# Patient Record
Sex: Male | Born: 2000 | Race: Black or African American | Hispanic: No | Marital: Single | Smoking: Current every day smoker
Health system: Southern US, Community
[De-identification: ages and names within clinical notes are randomized; demographics above are authoritative.]

## PROBLEM LIST (undated history)

## (undated) DIAGNOSIS — I10 Essential (primary) hypertension: Secondary | ICD-10-CM

## (undated) DIAGNOSIS — F319 Bipolar disorder, unspecified: Secondary | ICD-10-CM

## (undated) HISTORY — PX: CIRCUMCISION: SUR203

---
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
## 2018-04-09 ENCOUNTER — Other Ambulatory Visit: Payer: Self-pay

## 2018-04-09 ENCOUNTER — Emergency Department (HOSPITAL_COMMUNITY)
Admission: EM | Admit: 2018-04-09 | Discharge: 2018-04-09 | Disposition: A | Discharge status: Home / Self Care | Payer: Medicaid Other | Attending: Emergency Medicine | Admitting: Emergency Medicine

## 2018-04-09 ENCOUNTER — Encounter (HOSPITAL_COMMUNITY): Payer: Self-pay | Admitting: Emergency Medicine

## 2018-04-09 DIAGNOSIS — M79605 Pain in left leg: Secondary | ICD-10-CM | POA: Diagnosis not present

## 2018-04-09 DIAGNOSIS — J3489 Other specified disorders of nose and nasal sinuses: Secondary | ICD-10-CM | POA: Insufficient documentation

## 2018-04-09 DIAGNOSIS — Z5321 Procedure and treatment not carried out due to patient leaving prior to being seen by health care provider: Secondary | ICD-10-CM | POA: Diagnosis not present

## 2018-04-09 DIAGNOSIS — M79604 Pain in right leg: Secondary | ICD-10-CM | POA: Diagnosis not present

## 2018-04-09 HISTORY — DX: Essential (primary) hypertension: I10

## 2018-04-09 NOTE — ED Triage Notes (Signed)
C/o bilateral legs aching and runny nose since this morning.  Denies cough.  Denies fever.

## 2018-04-09 NOTE — ED Notes (Signed)
No answer for room x 1 

## 2018-04-09 NOTE — ED Notes (Signed)
Pt walking out of ED.  Informed him they were getting ready to call him to treatment room.  Visitor with him states they aren't waiting.

## 2018-04-09 NOTE — ED Notes (Signed)
Pt went outside to smoke

## 2018-06-22 ENCOUNTER — Observation Stay (HOSPITAL_COMMUNITY)
Admission: RE | Admit: 2018-06-22 | Discharge: 2018-06-22 | Disposition: A | Discharge status: Home / Self Care | Payer: Medicaid Other | Attending: Psychiatry | Admitting: Psychiatry

## 2018-06-22 ENCOUNTER — Encounter (HOSPITAL_COMMUNITY): Payer: Self-pay

## 2018-06-22 ENCOUNTER — Other Ambulatory Visit: Payer: Self-pay

## 2018-06-22 ENCOUNTER — Ambulatory Visit (HOSPITAL_COMMUNITY): Payer: Self-pay

## 2018-06-22 DIAGNOSIS — R456 Violent behavior: Secondary | ICD-10-CM | POA: Diagnosis not present

## 2018-06-22 DIAGNOSIS — F1290 Cannabis use, unspecified, uncomplicated: Secondary | ICD-10-CM | POA: Diagnosis not present

## 2018-06-22 DIAGNOSIS — F419 Anxiety disorder, unspecified: Secondary | ICD-10-CM | POA: Insufficient documentation

## 2018-06-22 DIAGNOSIS — F322 Major depressive disorder, single episode, severe without psychotic features: Secondary | ICD-10-CM | POA: Diagnosis not present

## 2018-06-22 DIAGNOSIS — F319 Bipolar disorder, unspecified: Secondary | ICD-10-CM | POA: Diagnosis present

## 2018-06-22 DIAGNOSIS — Z79899 Other long term (current) drug therapy: Secondary | ICD-10-CM | POA: Insufficient documentation

## 2018-06-22 DIAGNOSIS — F3189 Other bipolar disorder: Secondary | ICD-10-CM | POA: Diagnosis not present

## 2018-06-22 DIAGNOSIS — Z8249 Family history of ischemic heart disease and other diseases of the circulatory system: Secondary | ICD-10-CM | POA: Insufficient documentation

## 2018-06-22 DIAGNOSIS — F10920 Alcohol use, unspecified with intoxication, uncomplicated: Secondary | ICD-10-CM | POA: Diagnosis not present

## 2018-06-22 DIAGNOSIS — I10 Essential (primary) hypertension: Secondary | ICD-10-CM | POA: Insufficient documentation

## 2018-06-22 LAB — COMPREHENSIVE METABOLIC PANEL
ALT: 16 U/L (ref 0–44)
AST: 17 U/L (ref 15–41)
Albumin: 3.9 g/dL (ref 3.5–5.0)
Alkaline Phosphatase: 84 U/L (ref 38–126)
Anion gap: 13 (ref 5–15)
BUN: 8 mg/dL (ref 6–20)
CO2: 23 mmol/L (ref 22–32)
Calcium: 9.2 mg/dL (ref 8.9–10.3)
Chloride: 102 mmol/L (ref 98–111)
Creatinine, Ser: 0.89 mg/dL (ref 0.61–1.24)
GFR calc Af Amer: >60 mL/min (ref >60)
GFR calc non Af Amer: >60 mL/min (ref >60)
Glucose, Bld: 96 mg/dL (ref 70–99)
Potassium: 3.5 mmol/L (ref 3.5–5.1)
Sodium: 138 mmol/L (ref 135–145)
Total Bilirubin: 0.5 mg/dL (ref 0.3–1.2)
Total Protein: 7.3 g/dL (ref 6.5–8.1)

## 2018-06-22 LAB — CBC
HCT: 41.7 % (ref 39.0–52.0)
Hemoglobin: 13.6 g/dL (ref 13.0–17.0)
MCH: 30.7 pg (ref 26.0–34.0)
MCHC: 32.6 g/dL (ref 30.0–36.0)
MCV: 94.1 fL (ref 80.0–100.0)
Platelets: 334 10*3/uL (ref 150–400)
RBC: 4.43 MIL/uL (ref 4.22–5.81)
RDW: 12 % (ref 11.5–15.5)
WBC: 11.5 10*3/uL — ABNORMAL HIGH (ref 4.0–10.5)
nRBC: 0 % (ref 0.0–0.2)

## 2018-06-22 LAB — TSH: TSH: 1.847 u[IU]/mL (ref 0.350–4.500)

## 2018-06-22 MED ORDER — ALUM & MAG HYDROXIDE-SIMETH 200-200-20 MG/5ML PO SUSP
30.0000 mL | ORAL | Status: DC | PRN
  Filled 2018-06-22: qty 30

## 2018-06-22 MED ORDER — ACETAMINOPHEN 325 MG PO TABS
650.0000 mg | ORAL_TABLET | Freq: Four times a day (QID) | ORAL | Status: DC | PRN
  Filled 2018-06-22: qty 2

## 2018-06-22 MED ORDER — MAGNESIUM HYDROXIDE 400 MG/5ML PO SUSP
30.0000 mL | Freq: Every day | ORAL | Status: DC | PRN
  Filled 2018-06-22: qty 30

## 2018-06-22 MED ORDER — HYDROXYZINE HCL 25 MG PO TABS
25.0000 mg | ORAL_TABLET | Freq: Three times a day (TID) | ORAL | Status: DC | PRN
Start: 2018-06-22 — End: 2018-06-22
  Filled 2018-06-22: qty 1

## 2018-06-22 NOTE — Progress Notes (Signed)
Patient ID: Arthur Martinez, male   DOB: January 09, 2001, 18 y.o.   MRN: [id number]  Patient discharged per MD orders. Patient given education regarding follow-up appointments. Patient denies any questions or concerns about these instructions. Patient was  given belongings before discharge. Patient currently denies SI/HI and auditory and visual hallucinations on discharge.

## 2018-06-22 NOTE — Patient Outreach (Signed)
CPSS met with the patient in the 200 hall at [facility] in order to provide substance use recovery support and help with substance use treatment resources. Patient states his substance use is manageable at this time, and denies a past history of drug or alcohol addiction. CPSS still provided CPSS contact information. CPSS strongly encouraged the patient to continue to stay in contact with CPSS for substance use recovery support if his substance becomes unmanageable, and would like help getting connected to substance use treatment resources.

## 2018-06-22 NOTE — Plan of Care (Signed)
BHH Observation Crisis Plan  Reason for Crisis Plan:  Crisis Stabilization   Plan of Care:  Referral for Telepsychiatry/Psychiatric Consult  Family Support:      Current Living Environment:  Living Arrangements: Parent  Insurance:   Hospital Account    Name Acct ID Class Status Primary Coverage   Arthur Martinez, Arthur Martinez [id number] BEHAVIORAL HEALTH OBSERVATION Open [place]        Guarantor Account (for Hospital Account [id number])    Name Relation to Pt Service Area Active? Acct Type   Arthur Martinez Self [facility] Yes Behavioral Health   Address Phone       [address] [place], [place] [address] [phone](H)          Coverage Information (for Hospital Account [id number])    F/O Payor/Plan Precert #   [facility] [place]    Subscriber Subscriber #   Arthur Martinez, Arthur Martinez [id number]   Address Phone   [address] [place], [place] [address] [phone]      Legal Guardian:  Legal Guardian: (P) (self)  Primary Care Provider:  Charlene Brooke, MD  Current Outpatient Providers:  [facility]  Psychiatrist:  Name of Psychiatrist: (P) (none)  Counselor/Therapist:  Name of Therapist: (P) (none)  Compliant with Medications:  No  Additional Information:   Arthur Martinez 4/21/20203:43 AM

## 2018-06-22 NOTE — Discharge Instructions (Signed)
For your mental health needs, you are advised to follow up with [facility].  Call them at your earliest opportunity to schedule an intake appointment:       [facility]      [address]      [place], [place] [address]      [phone]      Crisis number: [phone]  When dealing with the loss of a family member or loved one, grief counseling may be helpful.  Contact [facility] at your earliest opportunity and ask about enrolling in their program:       [facility]      [address] [place], [place] [address]      [phone]
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
## 2018-06-22 NOTE — BH Assessment (Signed)
Assessment Note  Arthur Martinez is an 18 y.o. male presenting under IVC for being off medications, drug usage and increased depression. Patient reported having an argument with his mother regarding his younger brother. Patient reported his mother left the house, he left the house, upon return police met him at house and brought him in to [facility] under IVC. Patient denied SI, HI and psychosis. Patient reported and grief loss issues, increased depression since death of younger brother 2 months ago. Patient reported  Increased anger and always having problems with anger. Patient reported after having conversation with father he realized that he was mad at the world and that he was only hurting himself and that he has since learned let anger go. Patient reported continued depressive symptoms, feelings of guilt, isolation, increased sleep, loss of interest, irritable, increased anxiety, worthless and fatigue. Per medical record patient has history of bipolar and was seen in 2016 for depression at [facility]. See IVC below.   Patient reported reported residing with father. Patient denied receiving outpatient mental health services. Patient denied being on any mental health medications. Patient denied criminal charges and court dates. Patient is currently on probation for resisting arrest "because my friend was band from property". Patient reported he is currently completing his GED and is enrolled in [employer] [place]. Patient was cooperative during assessment.   Collateral Contact: Sumit Branham, father, [phone], attempted contact, no contact, TTS will attempt again to gain additional information regarding patient.  Father is petitioner of IVC.   PER IVC: -HE IS CURRENTLY OFF HIS MEDICATION. -HE ISN'T SLEEPING, EATING OR TENDING TO PERSONAL HYGIENE. -TODAY HE CALLED HIS EX GIRLFRIEND AND TOLD HER SHE SHOULD KILLED HIM WHEN HIS BROTHER DIED. -HE STATED THAT HE WANTED TO DIE -HE HAS  BEEN VERY AGGRESSIVE WITH THE POLICE DEPARTMENT. -HE HAS STATED SEVERAL TIMES THAT HE IS GOING TO SOMEONE ELSE TO GET HIS 'MONEY' EVEN IF HE HAS TO HURT THEM. -FAMILY FOUND HIM SLEEPING ON THE SIDE OF THE HIGHWAY.  -HE'S CURRENTLY A HEAVY DRUG USER (COCAINE, PERC, XANAX, HEROIN, ICE).   Diagnosis: Major depressive disorder  Past Medical History:  Past Medical History:  Diagnosis Date  . Hypertension     Past Surgical History:  Procedure Laterality Date  . CIRCUMCISION      Family History:  Family History  Problem Relation Age of Onset  . Heart attack Maternal Grandfather   . Bipolar disorder Maternal Grandfather   . Schizophrenia Maternal Grandfather   . Stroke Paternal Grandfather   . Depression Mother   . Anxiety disorder Mother   . ADD / ADHD Brother   . Depression Maternal Grandmother   . Anxiety disorder Maternal Grandmother   . Migraines Other     Social History:  reports that he has been smoking cigarettes. He has a 3.50 pack-year smoking history. He has never used smokeless tobacco. He reports current alcohol use. He reports current drug use. Frequency: 7.00 times per week. Drug: Marijuana.  Additional Social History:  Alcohol / Drug Use Pain Medications: see MAR Prescriptions: see MAR Over the Counter: see MAR  CIWA: CIWA-Ar BP: (!) 135/91 Pulse Rate: 92 Nausea and Vomiting: no nausea and no vomiting Tactile Disturbances: none Tremor: no tremor Auditory Disturbances: not present Paroxysmal Sweats: no sweat visible Visual Disturbances: not present Anxiety: no anxiety, at ease Headache, Fullness in Head: none present Agitation: normal activity Orientation and Clouding of Sensorium: oriented and can do serial additions CIWA-Ar Total: 0  COWS: Clinical Opiate Withdrawal Scale (COWS) Resting Pulse Rate: Pulse Rate 81-100 Sweating: No report of chills or flushing Restlessness: Able to sit still Pupil Size: Pupils pinned or normal size for room light Bone  or Joint Aches: Not present Runny Nose or Tearing: Not present GI Upset: Vomiting or diarrhea Tremor: No tremor Yawning: No yawning Anxiety or Irritability: Patient reports increasing irritability or anxiousness Gooseflesh Skin: Skin is smooth COWS Total Score: 5  Allergies: No Known Allergies  Home Medications:  Medications Prior to Admission  Medication Sig Dispense Refill  . acetaminophen (TYLENOL) 325 MG tablet Take 650 mg by mouth every 6 (six) hours as needed.    Marland Kitchen amitriptyline (ELAVIL) 25 MG tablet Take 1 tablet (25 mg total) by mouth at bedtime. 30 tablet 3  . amoxicillin (AMOXIL) 500 MG capsule Take 2 capsules (1,000 mg total) by mouth 2 (two) times daily. 40 capsule 0  . cloNIDine (CATAPRES) 0.3 MG tablet Take 0.3 mg by mouth at bedtime.    Marland Kitchen HYDROcodone-acetaminophen (NORCO/VICODIN) 5-325 MG tablet Take 1-2 tablets by mouth every 4 (four) hours as needed. 10 tablet 0  . Magnesium Oxide 500 MG TABS Take by mouth.    . riboflavin (VITAMIN B-2) 100 MG TABS tablet Take 100 mg by mouth daily.      OB/GYN Status:  No LMP for male patient.  General Assessment Data Location of Assessment: [facility] Services TTS Assessment: In system Is this a Tele or Face-to-Face Assessment?: Face-to-Face Is this an Initial Assessment or a Re-assessment for this encounter?: Initial Assessment Patient Accompanied by:: N/A Language Other than English: No Living Arrangements: (family home with father) What gender do you identify as?: Male Marital status: Single Living Arrangements: Parent Can pt return to current living arrangement?: Yes Admission Status: Involuntary Petitioner: Family member Is patient capable of signing voluntary admission?: (IVC) Referral Source: Self/Family/Friend  Crisis Care Plan Living Arrangements: Parent Legal Guardian: (self) Name of Psychiatrist: (none) Name of Therapist: (none)  Education Status Is patient currently in school?: Yes([employer]) Current  Grade: (GED) Highest grade of school patient has completed: (11th) Name of school: ([employer])  Risk to self with the past 6 months Suicidal Ideation: No Has patient been a risk to self within the past 6 months prior to admission? : No Suicidal Intent: No Has patient had any suicidal intent within the past 6 months prior to admission? : No Is patient at risk for suicide?: No Suicidal Plan?: No Has patient had any suicidal plan within the past 6 months prior to admission? : No Access to Means: No What has been your use of drugs/alcohol within the last 12 months?: ("beer") Previous Attempts/Gestures: No How many times?: (0) Other Self Harm Risks: (denied) Triggers for Past Attempts: (n/a) Intentional Self Injurious Behavior: None Family Suicide History: Yes(cousin 3 years ago) Recent stressful life event(s): (death of brother) Persecutory voices/beliefs?: No Depression: Yes Depression Symptoms: Insomnia, Tearfulness, Isolating, Fatigue, Guilt, Loss of interest in usual pleasures, Feeling worthless/self pity, Feeling angry/irritable Substance abuse history and/or treatment for substance abuse?: No Suicide prevention information given to non-admitted patients: Not applicable  Risk to Others within the past 6 months Homicidal Ideation: No Does patient have any lifetime risk of violence toward others beyond the six months prior to admission? : No Thoughts of Harm to Others: No Current Homicidal Intent: No Current Homicidal Plan: No Access to Homicidal Means: No Identified Victim: (n/a) History of harm to others?: No Assessment of Violence: None Noted Violent Behavior Description: (none  reported) Does patient have access to weapons?: No Criminal Charges Pending?: No Does patient have a court date: No Is patient on probation?: No  Psychosis Hallucinations: None noted Delusions: None noted  Mental Status Report Appearance/Hygiene: Disheveled Eye Contact: Poor Motor Activity:  Freedom of movement Speech: Slow, Soft Level of Consciousness: Drowsy Mood: Other (Comment)(lethargic) Affect: Anxious Anxiety Level: Minimal Thought Processes: Coherent Judgement: Partial Orientation: Person, Place, Time, Situation Obsessive Compulsive Thoughts/Behaviors: None  Cognitive Functioning Concentration: Fair Memory: Recent Impaired Is patient IDD: No Insight: Fair Impulse Control: Poor Appetite: Fair Have you had any weight changes? : No Change Sleep: Decreased Total Hours of Sleep: ("alot") Vegetative Symptoms: None  ADLScreening [facility] Assessment Services) Patient's cognitive ability adequate to safely complete daily activities?: Yes Patient able to express need for assistance with ADLs?: Yes Independently performs ADLs?: Yes (appropriate for developmental age)  Prior Inpatient Therapy Prior Inpatient Therapy: No  Prior Outpatient Therapy Prior Outpatient Therapy: No Does patient have an ACCT team?: No Does patient have Intensive In-House Services?  : No Does patient have [facility] services? : No Does patient have P4CC services?: No  ADL Screening (condition at time of admission) Patient's cognitive ability adequate to safely complete daily activities?: Yes Is the patient deaf or have difficulty hearing?: No Does the patient have difficulty seeing, even when wearing glasses/contacts?: No Does the patient have difficulty concentrating, remembering, or making decisions?: No Patient able to express need for assistance with ADLs?: Yes Does the patient have difficulty dressing or bathing?: No Independently performs ADLs?: Yes (appropriate for developmental age) Does the patient have difficulty walking or climbing stairs?: No Weakness of Legs: None Weakness of Arms/Hands: None  Home Assistive Devices/Equipment Home Assistive Devices/Equipment: None  Therapy Consults (therapy consults require a physician order) PT Evaluation Needed: No OT Evalulation Needed:  No SLP Evaluation Needed: No Abuse/Neglect Assessment (Assessment to be complete while patient is alone) Abuse/Neglect Assessment Can Be Completed: Yes Physical Abuse: Denies Verbal Abuse: Denies Sexual Abuse: Denies Exploitation of patient/patient's resources: Denies Self-Neglect: Denies Values / Beliefs Cultural Requests During Hospitalization: None Spiritual Requests During Hospitalization: None Consults Spiritual Care Consult Needed: No Social Work Consult Needed: No Regulatory affairs officer (For Healthcare) Does Patient Have a Medical Advance Directive?: No Would patient like information on creating a medical advance directive?: No - Patient declined Nutrition Screen- [facility] Adult/WL/AP Patient's home diet: Regular Has the patient recently lost weight without trying?: No Has the patient been eating poorly because of a decreased appetite?: No Malnutrition Screening Tool Score: 0   Disposition:  Disposition Initial Assessment Completed for this Encounter: Yes  Lindon Romp, NP, recommends overnight observation. Larose Kells, and Legrand Como, RN, informed of disposition.   On Site Evaluation by:   Reviewed with Physician:    Venora Maples, [facility] 06/22/2018 3:45 AM

## 2018-06-22 NOTE — H&P (Signed)
[facility] Provider Admission PAA/H&P  Patient Identification: Arthur Martinez MRN:  [id number] Date of Evaluation:  06/22/2018 Chief Complaint:  MDD Principal Diagnosis: Severe major depression, single episode, without psychotic features ([place]) Diagnosis:  Active Problems:   Severe major depression, single episode, without psychotic features ([place])  History of Present Illness: Patient presented to [facility] via law enforcement under involuntary commitment. Patient was petitioned by his father. Petition states: "He is currently off his medication. He isn't sleeping, eating or tending to personal hygiene. Today he called his ex girlfriend and told her she should kill him when his brother died. He stated that he wanted to die. He has been very aggressive with the police department. He has states several times that he is going to someone else to get his Money even if he has to hurt them. Family found him sleeping on the side of the highway. He's currently a heavy drug user (cocaine, perc, xanax, heroin, ice)."  Patient reports marijuana and alcohol use, but denies use of other substances. Denies SI/HI/AVH.  From TTS assessment 06/22/2018: Patient reported having an argument with his mother regarding his younger brother. Patient reported his mother left the house, he left the house, upon return police met him at house and brought him in to [facility] under IVC. Patient denied SI, HI and psychosis. Patient reported and grief loss issues, increased depression since death of younger brother 2 months ago. Patient reported  Increased anger and always having problems with anger. Patient reported after having conversation with father he realized that he was mad at the world and that he was only hurting himself and that he has since learned let anger go. Patient reported continued depressive symptoms, feelings of guilt, isolation, increased sleep, loss of interest, irritable, increased anxiety, worthless and fatigue.  Per medical record patient has history of bipolar and was seen in 2016 for depression at [facility]. See IVC below.   Patient reported reported residing with father. Patient denied receiving outpatient mental health services. Patient denied being on any mental health medications. Patient denied criminal charges and court dates. Patient is currently on probation for resisting arrest "because my friend was band from property". Patient reported he is currently completing his GED and is enrolled in [employer] [employer] [place]. Patient was cooperative during assessment.   On exam: Patient is alert and oriented x 4, pleasant, and cooperative. Appears drowsy, states that he is just tired. Speech is clear and coherent, normal pace, low in volume. Mood is anxious and depressed. Affect is congruent with mood. Appears the patient has been crying. States that he has been crying and feeling sad today because his brother recently died. Denies suicidal ideations, homicidal ideations, and audiovisual hallucinations. Reports that he did smoke marijuana tonight and had two beers earlier in the day. Patient vomited once during the exam, clear fluid. Per patient and police he did vomit three times tonight. Patient states that it is because he became anxious when the police arrived to pick him up.   On chart review it was noted that patient was seen by psychiatry in the ED at [facility] on 07/22/2014 for SI and irritability. He was diagnosed as Bipolar Disorder, mixed, without psychosis. At that time he was prescribed Risperdal and Neurontin. No other mental health related records found.   Associated Signs/Symptoms: Depression Symptoms:  depressed mood, feelings of worthlessness/guilt, difficulty concentrating, anxiety, loss of energy/fatigue, (Hypo) Manic Symptoms:  Impulsivity, Irritable Mood, Anxiety Symptoms:  general anxiety Psychotic Symptoms:  Denies PTSD Symptoms: Negative Total Time spent with  patient: 30 minutes  Past Psychiatric History:   On chart review it was noted that patient was seen by psychiatry in the ED at [facility] on 07/22/2014 for SI and irritability. He was diagnosed as Bipolar Disorder, mixed, without psychosis. At that time he was prescribed Risperdal and Neurontin.   Is the patient at risk to self? Yes.    Has the patient been a risk to self in the past 6 months? No.  Has the patient been a risk to self within the distant past? Yes.    Is the patient a risk to others? No.  Has the patient been a risk to others in the past 6 months? No.  Has the patient been a risk to others within the distant past? No.   Prior Inpatient Therapy: Prior Inpatient Therapy: No Prior Outpatient Therapy: Prior Outpatient Therapy: No Does patient have an ACCT team?: No Does patient have Intensive In-House Services?  : No Does patient have [facility] services? : No Does patient have P4CC services?: No  Alcohol Screening:   Substance Abuse History in the last 12 months:  Yes.   Consequences of Substance Abuse: Negative Previous Psychotropic Medications: Yes  Psychological Evaluations: Yes  Past Medical History:  Past Medical History:  Diagnosis Date  . Hypertension     Past Surgical History:  Procedure Laterality Date  . CIRCUMCISION     Family History:  Family History  Problem Relation Age of Onset  . Heart attack Maternal Grandfather   . Bipolar disorder Maternal Grandfather   . Schizophrenia Maternal Grandfather   . Stroke Paternal Grandfather   . Depression Mother   . Anxiety disorder Mother   . ADD / ADHD Brother   . Depression Maternal Grandmother   . Anxiety disorder Maternal Grandmother   . Migraines Other    Family Psychiatric History: Unknown Tobacco Screening:   Social History:  Social History   Substance and Sexual Activity  Alcohol Use Yes     Social History   Substance and Sexual Activity  Drug Use Yes  . Frequency: 7.0 times per week  . Types:  Marijuana    Additional Social History: Marital status: Single    Pain Medications: see MAR Prescriptions: see MAR Over the Counter: see MAR                    Allergies:  No Known Allergies Lab Results: No results found for this or any previous visit (from the past 48 hour(s)).  Blood Alcohol level:  Lab Results  Component Value Date   ETH <5 60/63/0160    Metabolic Disorder Labs:  No results found for: HGBA1C, MPG No results found for: PROLACTIN Lab Results  Component Value Date   CHOL  10/04/2007    152        ATP III CLASSIFICATION:  <200     mg/dL   Desirable  200-239  mg/dL   Borderline High  >=240    mg/dL   High   TRIG 50 10/04/2007   HDL 54 10/04/2007   CHOLHDL 2.8 10/04/2007   VLDL 10 10/04/2007   LDLCALC  10/04/2007    88        Total Cholesterol/HDL:CHD Risk Coronary Heart Disease Risk Table                     Men   Women  1/2 Average Risk   3.4  3.3    Current Medications: Current Facility-Administered Medications  Medication Dose Route Frequency Provider Last Rate Last Dose  . acetaminophen (TYLENOL) tablet 650 mg  650 mg Oral Q6H PRN Rozetta Nunnery, NP      . alum & mag hydroxide-simeth (MAALOX/MYLANTA) 200-200-20 MG/5ML suspension 30 mL  30 mL Oral Q4H PRN Lindon Romp A, NP      . hydrOXYzine (ATARAX/VISTARIL) tablet 25 mg  25 mg Oral TID PRN Lindon Romp A, NP      . magnesium hydroxide (MILK OF MAGNESIA) suspension 30 mL  30 mL Oral Daily PRN Rozetta Nunnery, NP       PTA Medications: Medications Prior to Admission  Medication Sig Dispense Refill Last Dose  . acetaminophen (TYLENOL) 325 MG tablet Take 650 mg by mouth every 6 (six) hours as needed.   08/11/2015 at 0915  . amitriptyline (ELAVIL) 25 MG tablet Take 1 tablet (25 mg total) by mouth at bedtime. 30 tablet 3   . amoxicillin (AMOXIL) 500 MG capsule Take 2 capsules (1,000 mg total) by mouth 2 (two) times daily. 40 capsule 0   . cloNIDine (CATAPRES) 0.3 MG tablet Take 0.3 mg by  mouth at bedtime.   Taking  . HYDROcodone-acetaminophen (NORCO/VICODIN) 5-325 MG tablet Take 1-2 tablets by mouth every 4 (four) hours as needed. 10 tablet 0   . Magnesium Oxide 500 MG TABS Take by mouth.     . riboflavin (VITAMIN B-2) 100 MG TABS tablet Take 100 mg by mouth daily.       Musculoskeletal: Strength & Muscle Tone: within normal limits Gait & Station: normal   Psychiatric Specialty Exam: Physical Exam  Constitutional: He is oriented to person, place, and time. He appears well-developed and well-nourished. No distress.  HENT:  Head: Normocephalic and atraumatic.  Right Ear: External ear normal.  Left Ear: External ear normal.  Eyes: Pupils are equal, round, and reactive to light. Conjunctivae are normal. Right eye exhibits no discharge. Left eye exhibits no discharge. No scleral icterus.  Respiratory: Effort normal. No respiratory distress.  Musculoskeletal: Normal range of motion.  Neurological: He is alert and oriented to person, place, and time.  Skin: He is not diaphoretic.  Psychiatric: His mood appears anxious. He is not withdrawn and not actively hallucinating. Thought content is not paranoid and not delusional. He expresses impulsivity and inappropriate judgment. He exhibits a depressed mood. He expresses no homicidal and no suicidal ideation.    Review of Systems  Constitutional: Negative for chills, diaphoresis, fever, malaise/fatigue and weight loss.  Respiratory: Negative for cough and shortness of breath.   Gastrointestinal: Negative for diarrhea, nausea and vomiting.  Psychiatric/Behavioral: Positive for depression and substance abuse. Negative for hallucinations, memory loss and suicidal ideas. The patient is nervous/anxious. The patient does not have insomnia.     Blood pressure (!) 135/91, pulse 92, temperature 98 F (36.7 C), temperature source Oral, resp. rate 18, SpO2 96 %.There is no height or weight on file to calculate BMI.  General Appearance:  Casual and Disheveled  Eye Contact:  Fair  Speech:  Clear and Coherent and Normal Rate  Volume:  Decreased  Mood:  Anxious and Depressed  Affect:  Congruent and Depressed  Thought Process:  Coherent and Descriptions of Associations: Intact  Orientation:  Full (Time, Place, and Person)  Thought Content:  Logical and Hallucinations: None  Suicidal Thoughts:  No  Homicidal Thoughts:  No  Memory:  Immediate;   Fair Recent;   Fair  Judgement:  Fair  Insight:  Fair  Psychomotor Activity:  Normal  Concentration:  Concentration: Fair and Attention Span: Fair  Recall:  [employer] of Knowledge:  Good  Language:  Good  Akathisia:  Negative  Handed:  Right  AIMS (if indicated):     Assets:  Communication Skills Desire for Improvement Financial Resources/Insurance Housing Intimacy Leisure Time Physical Health  ADL's:  Intact  Cognition:  WNL  Sleep:         Treatment Plan Summary: Daily contact with patient to assess and evaluate symptoms and progress in treatment and Plan observation and review of IVC in the am.  Observation Level/Precautions:  15 minute checks Laboratory:  CBC Chemistry Profile UDS TSH Psychotherapy:  individual Medications:  Vistaril 25 mg prn anxiety Consultations: as needed Discharge Concerns:    Estimated LOS: <24 hours Other:      Rozetta Nunnery, NP 4/21/20205:33 AM

## 2018-06-22 NOTE — H&P (Signed)
Behavioral Health Medical Screening Exam  Arthur Martinez is an 18 y.o. male.  Total Time spent with patient: 30 minutes  Psychiatric Specialty Exam: Physical Exam  Constitutional: He is oriented to person, place, and time. He appears well-developed and well-nourished. No distress.  HENT:  Head: Normocephalic and atraumatic.  Right Ear: External ear normal.  Left Ear: External ear normal.  Eyes: Pupils are equal, round, and reactive to light. Conjunctivae are normal. Right eye exhibits no discharge. Left eye exhibits no discharge. No scleral icterus.  Respiratory: Effort normal. No respiratory distress.  Musculoskeletal: Normal range of motion.  Neurological: He is alert and oriented to person, place, and time.  Skin: He is not diaphoretic.  Psychiatric: His mood appears anxious. He is not withdrawn and not actively hallucinating. Thought content is not paranoid and not delusional. He expresses impulsivity and inappropriate judgment. He exhibits a depressed mood. He expresses no homicidal and no suicidal ideation.    Review of Systems  Constitutional: Negative for chills, fever and weight loss.  Respiratory: Negative for cough and shortness of breath.   Cardiovascular: Negative for chest pain.  Gastrointestinal: Positive for nausea and vomiting. Negative for diarrhea.  Neurological: Negative for dizziness.  Psychiatric/Behavioral: Positive for depression, substance abuse and suicidal ideas. Negative for hallucinations and memory loss. The patient is nervous/anxious. The patient does not have insomnia.     Blood pressure (!) 135/91, pulse 92, temperature 98 F (36.7 C), temperature source Oral, resp. rate 18, SpO2 96 %.There is no height or weight on file to calculate BMI.  General Appearance: Casual and Fairly Groomed  Eye Contact:  Good  Speech:  Clear and Coherent and Normal Rate  Volume:  Decreased  Mood:  Anxious and Depressed  Affect:  Congruent and Depressed  Thought  Process:  Coherent, Goal Directed and Descriptions of Associations: Intact  Orientation:  Full (Time, Place, and Person)  Thought Content:  Logical and Hallucinations: None  Suicidal Thoughts:  No  Homicidal Thoughts:  No  Memory:  Immediate;   Good Recent;   Fair  Judgement:  Fair  Insight:  Fair  Psychomotor Activity:  Normal  Concentration: Concentration: Fair and Attention Span: Fair  Recall:  Good  Fund of Knowledge:Good  Language: Good  Akathisia:  Negative  Handed:  Right  AIMS (if indicated):     Assets:  Communication Skills Desire for Improvement Financial Resources/Insurance Housing Intimacy Leisure Time Physical Health  Sleep:       Musculoskeletal: Strength & Muscle Tone: within normal limits Gait & Station: normal   Blood pressure (!) 135/91, pulse 92, temperature 98 F (36.7 C), temperature source Oral, resp. rate 18, SpO2 96 %.  Recommendations:  Based on my evaluation the patient does not appear to have an emergency medical condition.  Jackelyn Poling, NP 06/22/2018, 2:19 AM

## 2018-06-22 NOTE — BH Assessment (Signed)
[facility] Assessment Progress Note  Per Juanetta Beets, DO, this pt does not require psychiatric hospitalization at this time.  Pt presents under IVC initiated by pt's father, which Dr Sharma Covert has rescinded.  Pt is to be discharged from [facility] with referral information for [facility] for psychiatry, and for [facility] for grief counseling.  This has been included in pt's discharge instructions.  Pt would also benefit from seeing Peer Support Specialists; they will be asked to speak to pt.  Pt's nurse, Rayfield Citizen, has been notified.  At the request of Dr Sharma Covert, this pt called pt's father, Joe Fazzina ([phone]), to notify him of pt's disposition.  Initial call was placed at 12:08, and it rolled to voice mail with no room in the mailbox.  A second call was placed at 13:15, and I reached the father.  He reports that pt is supposed to go to [facility] for psychiatry.  He reports that he petitioned on pt in the hope that he would get help for substance abuse problems.  This Clinical research associate provided the father with information for Nar-Anon.  Father agrees to come to [facility] to pick pt up.  Doylene Canning, MA Triage Specialist [phone]

## 2018-06-22 NOTE — Progress Notes (Signed)
Pt admitted to the observation unit, pt stated he got into an argument with his step-mom and they called the police. Pt denies SI/ HI/ AVH at this time.   A: Skin was assessed and found to be clear of any abnormal marks apart from bruise L-face, birthmark L-hip, scar R-leg / ankle, bruises L / R wrist, abrasion knees, scar-back. PT searched and no contraband found, POC and unit policies explained and understanding verbalized. Consents obtained. Food and fluids offered, and fluids accepted.  R:  Pt had no additional questions or concerns.

## 2018-06-23 NOTE — BHH Suicide Risk Assessment (Signed)
[facility] Discharge Suicide Risk Assessment   Principal Problem: Severe major depression, single episode, without psychotic features (HCC) Discharge Diagnoses: Principal Problem:   Severe major depression, single episode, without psychotic features (HCC)   Total Time spent with patient: 30 minutes  Musculoskeletal: Strength & Muscle Tone: within normal limits Gait & Station: normal Patient leans: N/A  Psychiatric Specialty Exam: Review of Systems  Psychiatric/Behavioral: Positive for depression and substance abuse. Negative for suicidal ideas.  All other systems reviewed and are negative.   Blood pressure 137/88, pulse 78, temperature 98.3 F (36.8 C), resp. rate 16, SpO2 96 %.There is no height or weight on file to calculate BMI.  General Appearance: Casual  Eye Contact::  Good  Speech:  Clear and Coherent and Normal Rate  Volume:  Normal  Mood:  Depressed  Affect:  Congruent  Thought Process:  Goal Directed, Linear and Descriptions of Associations: Intact  Orientation:  Full (Time, Place, and Person)  Thought Content:  Logical  Suicidal Thoughts:  No  Homicidal Thoughts:  No  Memory:  Immediate;   Good Recent;   Good Remote;   Good  Judgement:  Fair  Insight:  Fair  Psychomotor Activity:  Normal  Concentration:  Good  Recall:  Good  Fund of Knowledge:Good  Language: Good  Akathisia:  No  Handed:  Right  AIMS (if indicated):   N/A  Assets:  Communication Skills Desire for Improvement Financial Resources/Insurance Housing Physical Health Resilience Social Support  Sleep:   N/A  Cognition: WNL  ADL's:  Intact   Mental Status Per Nursing Assessment::   On Admission:  NA  Demographic Factors:  Male, Adolescent or young adult and Unemployed  Loss Factors: Legal issues  Historical Factors: Impulsivity  Risk Reduction Factors:   Sense of responsibility to family, Living with another person, especially a relative and Positive social support  Continued  Clinical Symptoms:  Alcohol/Substance Abuse/Dependencies  Cognitive Features That Contribute To Risk:  None    Suicide Risk:  Minimal: No identifiable suicidal ideation.  Patients presenting with no risk factors but with morbid ruminations; may be classified as minimal risk based on the severity of the depressive symptoms    Plan Of Care/Follow-up recommendations:  -Patient will be provided with outpatient mental health resources.  -Discharge home.   Cherly Beach, DO 06/23/2018, 2:01 PM

## 2018-08-29 ENCOUNTER — Emergency Department (HOSPITAL_COMMUNITY)
Admission: EM | Admit: 2018-08-29 | Discharge: 2018-08-30 | Disposition: A | Discharge status: Home / Self Care | Payer: Medicaid Other | Attending: Emergency Medicine | Admitting: Emergency Medicine

## 2018-08-29 ENCOUNTER — Encounter (HOSPITAL_COMMUNITY): Payer: Self-pay | Admitting: Emergency Medicine

## 2018-08-29 ENCOUNTER — Other Ambulatory Visit: Payer: Self-pay

## 2018-08-29 DIAGNOSIS — T424X1A Poisoning by benzodiazepines, accidental (unintentional), initial encounter: Secondary | ICD-10-CM | POA: Insufficient documentation

## 2018-08-29 DIAGNOSIS — F29 Unspecified psychosis not due to a substance or known physiological condition: Secondary | ICD-10-CM | POA: Insufficient documentation

## 2018-08-29 DIAGNOSIS — F322 Major depressive disorder, single episode, severe without psychotic features: Secondary | ICD-10-CM | POA: Insufficient documentation

## 2018-08-29 DIAGNOSIS — I10 Essential (primary) hypertension: Secondary | ICD-10-CM | POA: Insufficient documentation

## 2018-08-29 DIAGNOSIS — R4689 Other symptoms and signs involving appearance and behavior: Secondary | ICD-10-CM

## 2018-08-29 DIAGNOSIS — F17210 Nicotine dependence, cigarettes, uncomplicated: Secondary | ICD-10-CM | POA: Insufficient documentation

## 2018-08-29 DIAGNOSIS — Z03818 Encounter for observation for suspected exposure to other biological agents ruled out: Secondary | ICD-10-CM | POA: Insufficient documentation

## 2018-08-29 LAB — SARS CORONAVIRUS 2 BY RT PCR (HOSPITAL ORDER, PERFORMED IN ~~LOC~~ HOSPITAL LAB): SARS Coronavirus 2: NEGATIVE

## 2018-08-29 LAB — CBC
HCT: 35.5 % — ABNORMAL LOW (ref 39.0–52.0)
Hemoglobin: 11.9 g/dL — ABNORMAL LOW (ref 13.0–17.0)
MCH: 31.4 pg (ref 26.0–34.0)
MCHC: 33.5 g/dL (ref 30.0–36.0)
MCV: 93.7 fL (ref 80.0–100.0)
Platelets: 248 10*3/uL (ref 150–400)
RBC: 3.79 MIL/uL — ABNORMAL LOW (ref 4.22–5.81)
RDW: 12.3 % (ref 11.5–15.5)
WBC: 7.3 10*3/uL (ref 4.0–10.5)
nRBC: 0 % (ref 0.0–0.2)

## 2018-08-29 LAB — ETHANOL: Alcohol, Ethyl (B): <10 mg/dL (ref <10)

## 2018-08-29 LAB — COMPREHENSIVE METABOLIC PANEL
ALT: 24 U/L (ref 0–44)
AST: 25 U/L (ref 15–41)
Albumin: 3.5 g/dL (ref 3.5–5.0)
Alkaline Phosphatase: 74 U/L (ref 38–126)
Anion gap: 9 (ref 5–15)
BUN: 10 mg/dL (ref 6–20)
CO2: 27 mmol/L (ref 22–32)
Calcium: 9.4 mg/dL (ref 8.9–10.3)
Chloride: 105 mmol/L (ref 98–111)
Creatinine, Ser: 1.01 mg/dL (ref 0.61–1.24)
GFR calc Af Amer: >60 mL/min (ref >60)
GFR calc non Af Amer: >60 mL/min (ref >60)
Glucose, Bld: 87 mg/dL (ref 70–99)
Potassium: 3.9 mmol/L (ref 3.5–5.1)
Sodium: 141 mmol/L (ref 135–145)
Total Bilirubin: 0.3 mg/dL (ref 0.3–1.2)
Total Protein: 6.4 g/dL — ABNORMAL LOW (ref 6.5–8.1)

## 2018-08-29 LAB — SALICYLATE LEVEL: Salicylate Lvl: <7 mg/dL (ref 2.8–30.0)

## 2018-08-29 LAB — ACETAMINOPHEN LEVEL: Acetaminophen (Tylenol), Serum: <10 ug/mL — ABNORMAL LOW (ref 10–30)

## 2018-08-29 MED ORDER — LORAZEPAM 1 MG PO TABS
1.0000 mg | ORAL_TABLET | ORAL | Status: AC | PRN
  Administered 2018-08-29: 1 mg via ORAL
  Filled 2018-08-29: qty 1

## 2018-08-29 MED ORDER — ACETAMINOPHEN 325 MG PO TABS: 650.0000 mg | ORAL_TABLET | ORAL | Status: DC | PRN

## 2018-08-29 MED ORDER — HALOPERIDOL LACTATE 5 MG/ML IJ SOLN
5.0000 mg | Freq: Once | INTRAMUSCULAR | Status: AC
  Administered 2018-08-29: 5 mg via INTRAMUSCULAR
  Filled 2018-08-29: qty 1

## 2018-08-29 MED ORDER — NICOTINE 21 MG/24HR TD PT24: 21.0000 mg | MEDICATED_PATCH | Freq: Every day | TRANSDERMAL | Status: DC

## 2018-08-29 MED ORDER — ZIPRASIDONE MESYLATE 20 MG IM SOLR: 20.0000 mg | Freq: Two times a day (BID) | INTRAMUSCULAR | Status: DC | PRN

## 2018-08-29 NOTE — ED Notes (Signed)
Ordered lunch 

## 2018-08-29 NOTE — ED Notes (Signed)
Handcuffs removed by [employer]. Patient ambulated to restroom and filled cup full of water. Patient brought back into room. Security at bedside. Patient attempting to leave.

## 2018-08-29 NOTE — ED Triage Notes (Signed)
Per [employer] patient brought in handcuffed after being IVCd by father. [employer] states pt was borderline responsive at home in the car and had to be stimulated. Patient admits to blue pills and alcohol. Father states patient has been threatening him with a knife and reports SI thoughts with no plan. Reports patient has been ingesting percocet's and heroin.

## 2018-08-29 NOTE — ED Notes (Addendum)
Pt mother called wanted an update told mother pt is being held to get help pt mother states pt has been trying to use heroin and Chinita Pester pt mother states that his brother died in Apr 06, 2022 and has been going down since then from this.

## 2018-08-29 NOTE — ED Notes (Signed)
Patient resting in bed comfortably and attempting to call family.

## 2018-08-29 NOTE — ED Notes (Signed)
IVC - 1st Exam completed by Dr Rex Kras - Copy of IVC papers faxed to [facility] - Copy sent to Medical Records - Original placed in folder for [place] 3 sets on clipboard.

## 2018-08-29 NOTE — ED Notes (Signed)
Pt arrived to Rm [facility] via stretcher - wearing burgundy scrubs. Pt noted to be soiled w/urine. Unable to arose pt at this time. Respirations even, unlabored. Sitter w/pt. Aware of need for urine specimen - cup given. Will re-order TTS when pt awakens.

## 2018-08-29 NOTE — ED Notes (Signed)
Won't have a Actuary till 3

## 2018-08-29 NOTE — ED Notes (Signed)
RN requested patient to change into scrubs. Patient refusing to cooperate. Security and [employer] at bedside attempting to deescalate patient. Patient attempting to lung at staff and run to door. Security placing patient back into bed. MD aware and ordering medication to calm patient.

## 2018-08-29 NOTE — ED Notes (Signed)
Belongings inventoried - 1 labeled belongings bag w/Valuables Envelope w/$10 dollar bill - placed in [place] #1.

## 2018-08-29 NOTE — BH Assessment (Signed)
[facility] Assessment Progress Note   TTS attempted to see patient at 12:34, but his nurse Joellen Jersey indicated that patient is combative and uncooperative and they are in the process of giving him Haldol to sedate him.

## 2018-08-29 NOTE — ED Notes (Signed)
PT phone numbers:  Rhone Ozaki (father) [phone] Sadler Teschner (aunt) [phone] Denton Ar (girlfriend) [phone]

## 2018-08-29 NOTE — ED Provider Notes (Signed)
[facility] EMERGENCY DEPARTMENT Provider Note   CSN: [id number] Arrival date & time: 08/29/18  1054    History   Chief Complaint Chief Complaint  Patient presents with  . IVC/combative    HPI Arthur Martinez is a 18 y.o. male.     18 year old male with past medical history including anxiety/depression, ADHD, hypertension who presents with IVC.  Patient was brought in by [facility] after he was involuntarily committed by his father.  Father stated that patient was holding a knife and threatening to kill himself or hurt others.  He admits to taking Xanax pills and using marijuana and alcohol.  Father reported that he has been taking Percocets and heroin.  He has history of psychiatric hospitalization.  LEVEL 5 CAVEAT DUE TO AMS  The history is provided by the EMS personnel, the patient and a parent.    Past Medical History:  Diagnosis Date  . Hypertension     Patient Active Problem List   Diagnosis Date Noted  . Severe major depression, single episode, without psychotic features ([place]) 06/22/2018  . Tension headache 03/08/2014  . Anxiety state 03/08/2014  . Insomnia 03/08/2014  . Aggressive behavior 03/08/2014  . 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 CIRCUMCISION          Home Medications    Prior to Admission medications   Not on File    Family History Family History  Problem Relation Age of Onset  . Heart attack Maternal Grandfather   . Bipolar disorder Maternal Grandfather   . Schizophrenia Maternal Grandfather   . Stroke Paternal Grandfather   . Depression Mother   . Anxiety disorder Mother   . ADD / ADHD Brother   . Depression Maternal Grandmother   . Anxiety disorder Maternal Grandmother   . Migraines Other     Social History Social History   Tobacco Use  . Smoking status: Current Every Day Smoker    Packs/day: 0.50    Years: 7.00    Pack years: 3.50    Types: Cigarettes  . Smokeless tobacco: Never Used  . Tobacco comment: Tried smoking and dipping once  Substance Use Topics  . Alcohol use: Yes  . Drug use: Yes    Frequency: 7.0 times per week    Types: Marijuana     Allergies   Patient has no known allergies.   Review of Systems Review of Systems  Unable to perform ROS: Mental status change     Physical Exam Updated Vital Signs BP 130/82 (BP Location: Right Arm)   Pulse 98   Temp 97.7 F (36.5 C) (Oral)   Resp 20   SpO2 99%   Physical Exam Vitals signs and nursing note reviewed.  Constitutional:      General: He is not in acute distress.    Appearance: He is well-developed.  HENT:     Head: Normocephalic and atraumatic.  Eyes:     Conjunctiva/sclera: Conjunctivae normal.     Pupils: Pupils are equal, round, and reactive to light.  Neck:     Musculoskeletal: Neck supple.  Skin:    General: Skin is warm and dry.  Neurological:     Mental Status: He is alert.     Comments: Slurred speech  Psychiatric:        Behavior: Behavior is combative.        Cognition and Memory: Cognition is impaired.  Judgment: Judgment is impulsive and inappropriate.     Comments: Disheveled, appears intoxicated      ED Treatments / Results  Labs (all labs ordered are listed, but only abnormal results are displayed) Labs Reviewed  ACETAMINOPHEN LEVEL - Abnormal; Notable for the following components:      Result Value   Acetaminophen (Tylenol), Serum <10 (*)    All other components within normal limits  COMPREHENSIVE METABOLIC PANEL - Abnormal; Notable for the following components:   Total Protein 6.4 (*)    All other components within normal limits  CBC - Abnormal; Notable for the following components:   RBC 3.79 (*)    Hemoglobin 11.9 (*)    HCT 35.5 (*)    All other components within normal limits  ETHANOL  SALICYLATE LEVEL  RAPID URINE DRUG SCREEN, HOSP PERFORMED    EKG EKG  Interpretation  Date/Time:  Sunday August 29 2018 11:24:33 EDT Ventricular Rate:  84 PR Interval:    QRS Duration: 84 QT Interval:  355 QTC Calculation: 420 R Axis:   63 Text Interpretation:  Sinus rhythm No significant change since last tracing Confirmed by Frederick PeersLittle, Coleson Kant [phone]) on 08/29/2018 12:29:15 PM   Radiology No results found.  Procedures Procedures (including critical care time)  Medications Ordered in ED Medications  ziprasidone (GEODON) injection 20 mg (has no administration in time range)    And  LORazepam (ATIVAN) tablet 1 mg (has no administration in time range)  acetaminophen (TYLENOL) tablet 650 mg (has no administration in time range)  nicotine (NICODERM CQ - dosed in mg/24 hours) patch 21 mg (has no administration in time range)  haloperidol lactate (HALDOL) injection 5 mg (5 mg Intramuscular Given 08/29/18 1217)  haloperidol lactate (HALDOL) injection 5 mg (5 mg Intramuscular Given 08/29/18 1239)     Initial Impression / Assessment and Plan / ED Course  I have reviewed the triage vital signs and the nursing notes.  Pertinent labs that were available during my care of the patient were reviewed by me and considered in my medical decision making (see chart for details).       Pt awake, appeared intoxicated on arrival, stable VS. Eventually became agitated and required IM haldol x2 for combativeness. Labwork unremarkable and pt medically clear for TTS eval. I have completed 2nd form for IVC. Dispo pending psychiatry team recommendations.  Final Clinical Impressions(s) / ED Diagnoses   Final diagnoses:  None    ED Discharge Orders    None       Tomi Paddock, Ambrose Finlandachel Morgan, MD 08/29/18 1300

## 2018-08-30 ENCOUNTER — Other Ambulatory Visit: Payer: Self-pay

## 2018-08-30 ENCOUNTER — Inpatient Hospital Stay (HOSPITAL_COMMUNITY)
Admission: AD | Admit: 2018-08-30 | Discharge: 2018-09-02 | DRG: 885 | Disposition: A | Discharge status: Home / Self Care | Payer: Medicaid Other | Attending: Psychiatry | Admitting: Psychiatry

## 2018-08-30 ENCOUNTER — Encounter (HOSPITAL_COMMUNITY): Payer: Self-pay

## 2018-08-30 DIAGNOSIS — F17210 Nicotine dependence, cigarettes, uncomplicated: Secondary | ICD-10-CM | POA: Diagnosis present

## 2018-08-30 DIAGNOSIS — F322 Major depressive disorder, single episode, severe without psychotic features: Secondary | ICD-10-CM | POA: Diagnosis present

## 2018-08-30 DIAGNOSIS — F902 Attention-deficit hyperactivity disorder, combined type: Secondary | ICD-10-CM | POA: Diagnosis present

## 2018-08-30 DIAGNOSIS — F411 Generalized anxiety disorder: Secondary | ICD-10-CM | POA: Diagnosis present

## 2018-08-30 DIAGNOSIS — R45851 Suicidal ideations: Secondary | ICD-10-CM | POA: Diagnosis present

## 2018-08-30 DIAGNOSIS — F329 Major depressive disorder, single episode, unspecified: Secondary | ICD-10-CM | POA: Diagnosis present

## 2018-08-30 LAB — RAPID URINE DRUG SCREEN, HOSP PERFORMED
Amphetamines: NOT DETECTED
Barbiturates: NOT DETECTED
Benzodiazepines: POSITIVE — AB
Cocaine: NOT DETECTED
Opiates: POSITIVE — AB
Tetrahydrocannabinol: POSITIVE — AB

## 2018-08-30 MED ORDER — TRAZODONE HCL 50 MG PO TABS
50.0000 mg | ORAL_TABLET | Freq: Every evening | ORAL | Status: DC | PRN
  Administered 2018-08-30 – 2018-08-31 (×2): 50 mg via ORAL
  Filled 2018-08-30 (×2): qty 1

## 2018-08-30 MED ORDER — LORAZEPAM 1 MG PO TABS
1.0000 mg | ORAL_TABLET | Freq: Four times a day (QID) | ORAL | Status: DC | PRN
  Administered 2018-08-30: 1 mg via ORAL
  Filled 2018-08-30: qty 1

## 2018-08-30 MED ORDER — NICOTINE 21 MG/24HR TD PT24
21.0000 mg | MEDICATED_PATCH | Freq: Every day | TRANSDERMAL | Status: DC
  Filled 2018-08-30 (×5): qty 1

## 2018-08-30 MED ORDER — HYDROXYZINE HCL 25 MG PO TABS
25.0000 mg | ORAL_TABLET | Freq: Three times a day (TID) | ORAL | Status: DC | PRN
  Administered 2018-08-30 – 2018-09-02 (×4): 25 mg via ORAL
  Filled 2018-08-30 (×4): qty 1

## 2018-08-30 MED ORDER — ACETAMINOPHEN 325 MG PO TABS
650.0000 mg | ORAL_TABLET | Freq: Four times a day (QID) | ORAL | Status: DC | PRN
  Administered 2018-08-31: 650 mg via ORAL
  Filled 2018-08-30: qty 2

## 2018-08-30 MED ORDER — MAGNESIUM HYDROXIDE 400 MG/5ML PO SUSP: 30.0000 mL | Freq: Every day | ORAL | Status: DC | PRN

## 2018-08-30 MED ORDER — ALUM & MAG HYDROXIDE-SIMETH 200-200-20 MG/5ML PO SUSP: 30.0000 mL | ORAL | Status: DC | PRN

## 2018-08-30 MED ORDER — LOPERAMIDE HCL 2 MG PO CAPS
2.0000 mg | ORAL_CAPSULE | ORAL | Status: DC | PRN
  Filled 2018-08-30: qty 2

## 2018-08-30 MED ORDER — ONDANSETRON 4 MG PO TBDP: 4.0000 mg | ORAL_TABLET | Freq: Four times a day (QID) | ORAL | Status: DC | PRN

## 2018-08-30 NOTE — Tx Team (Signed)
Initial Treatment Plan 08/30/2018 6:10 PM Arthur Martinez [id number]    PATIENT STRESSORS: Health problems Medication change or noncompliance Substance abuse   PATIENT STRENGTHS: Ability for insight Communication skills Motivation for treatment/growth Physical Health   PATIENT IDENTIFIED PROBLEMS: "heroin abuse"  "depression"  "anxiety"                 DISCHARGE CRITERIA:  Ability to meet basic life and health needs Adequate post-discharge living arrangements Motivation to continue treatment in a less acute level of care  PRELIMINARY DISCHARGE PLAN: Attend aftercare/continuing care group Attend PHP/IOP Return to previous living arrangement  PATIENT/FAMILY INVOLVEMENT: This treatment plan has been presented to and reviewed with the patient, Arthur Martinez.  The patient and family have been given the opportunity to ask questions and make suggestions.  Baron Sane, RN 08/30/2018, 6:10 PM

## 2018-08-30 NOTE — ED Notes (Signed)
Pt's breakfast tray arrived 

## 2018-08-30 NOTE — ED Notes (Signed)
While doing pt's vitals, pt stated that he couldn't keep staying here (in the room). Informed Luellen Pucker - RN.

## 2018-08-30 NOTE — BH Assessment (Signed)
Patient contacted RN to set up telepsych. Per Luellen Pucker, RN patient continues to sleep. She states she will contact TTS when awake.

## 2018-08-30 NOTE — ED Notes (Signed)
Pt's lunch arrived 

## 2018-08-30 NOTE — ED Provider Notes (Signed)
Emergency Medicine Observation Re-evaluation Note  Arthur Martinez is a 18 y.o. male, seen on rounds today.  Pt initially presented to the ED for complaints of IVC/combative Currently, the patient is sleeping comfortably.  Physical Exam  BP (!) 170/85   Pulse 79   Temp (!) 97.4 F (36.3 C)   Resp 14   SpO2 100%  Physical Exam Constitutional:      Appearance: Normal appearance.  Pulmonary:     Effort: Pulmonary effort is normal.  Neurological:     Mental Status: He is alert.     ED Course / MDM  EKG:EKG Interpretation  Date/Time:  Sunday August 29 2018 11:24:33 EDT Ventricular Rate:  84 PR Interval:    QRS Duration: 84 QT Interval:  355 QTC Calculation: 420 R Axis:   63 Text Interpretation:  Sinus rhythm No significant change since last tracing Confirmed by Theotis Burrow [phone]) on 08/29/2018 12:29:15 PM    I have reviewed the labs performed to date as well as medications administered while in observation.  Recent changes in the last 24 hours include N/A; no changes since being medically cleared in the ED. Plan  Current plan is for TTS evaluation. Pt was too somnolent last night after administration of Haldol; still sleeping today. RN to communicate with TTS once patient is awake.  Patient is under full IVC at this time.   Arthur Maize, PA-C 08/30/18 1800    Charlesetta Shanks, MD 08/31/18 [phone]

## 2018-08-30 NOTE — ED Triage Notes (Signed)
[place] called spoke with Arthur Martinez to report Pt is now awake and can participate in TTS.

## 2018-08-30 NOTE — ED Notes (Signed)
Patient verbalizes understanding of discharge instructions . Opportunity for questions and answers were provided . Armband removed by staff ,Pt discharged from ED. W/C  offered at D/C  and Declined W/C at D/C and was escorted to lobby by RN.  

## 2018-08-30 NOTE — Progress Notes (Signed)
Patient ID: Arthur Martinez, male   DOB: [birth year], 18 y.o.   MRN: [id number] Admission Note  Pt is a 18 yo male that presents IVC'd by his father on 08/30/2018 with worsening depression/anxiety and suicidal ideations. Pt states that his stressors include his brother dying recently by gun shot. Pt's brother was 43. Pt states he is bipolar and has been off his meds for about a year. Pt states he has had suicidal ideations but no plan. Pt states they have been using cannabis and heroin. Pt has complaints of detox symptoms. Pt is guarded in his assessment. Pt reports he was told he would only be here for 24 hours. Pt educated.  From a previous note:  Arthur Martinez is an 18 y.o. male presenting to [facility] ED via [employer] under IVC. Per EDP note: "Patient was brought in by [facility] after he was involuntarily committed by his father. Father stated that patient was holding a knife and threatening to kill himself or hurt others. He admits to taking Xanax pills and using marijuana and alcohol. Father reported that he has been taking Percocets and heroin. He has history of psychiatric hospitalization." Patient was combative upon arrival to ED. He was lunging at staff, attempting to elope, and required sedation. Patient not seen until following afternoon.  Pt denies a PCP or dentist. Pt denies past/present verbal/physical/sexual abuse. Pt states he is having back pain mid thoracic that he rates at a 7/10. Pt states he wants to get back on his medications and work through his depression/anxiety over his brothers murder. Pt denies si/hi/ah/vh and verbally agrees to approach staff if these become apparent or before harming himself/others while at [place] signed, skin/belongings search completed and patient oriented to unit. Patient stable at this time. Patient given the opportunity to express concerns and ask questions. Patient given toiletries. Will continue to monitor.

## 2018-08-30 NOTE — BH Assessment (Signed)
Tele Assessment Note   Patient Name: Arthur Martinez MRN: [id number] Referring Physician: Clarene Martinez Location of [facility]: [facility] ED Location of Provider: Behavioral Health TTS Department  Arthur NapJalen Phillis HaggisRiley Martinez is an 18 y.o. male [facility] via [employer] under IVC. Per EDP note: "[facility] was involuntarily committed by his father.  Father stated that patient was holding a knife and threatening to kill himself or hurt others.  He admits to taking Xanax pills and using marijuana and alcohol.  Father reported that he has been taking Percocets and heroin.  He has history of psychiatric hospitalization." Patient was combative upon arrival to ED. He was lunging at staff, attempting to elope, and required sedation. Patient not seen until following afternoon.  Upon this clinician's exam patient is calm and cooperative. When asked why he is in the hospital he states "I'm having some emotional problems since my little brother died. He was shot in January. He was 17." Patient admits to using Xanax the previous day "trying to get rid of the pain." Patient denies any other substance use and UDS has not been collected at time of assessment. Per IVC and chart review patient is also using alcohol, THC, and opiates. Patient denies SI/HI/AVH. Patient recalls an argument with family the previous day but does not recall specifics. Patient does not have any outpatient resources but states he used to see a therapist and he could go back to that one. Patient gave verbal consent for TTS to contact his father, Arthur Martinez [phone]) and girlfriend, Arthur Martinez [phone]). Since patient lives with father, this clinician attempted to reach him however he did not answer and he mailbox was full.   Per Arthur Martinez: Patient has been abusing Xanax bars. She states he was at his house 3 nights ago and he had used so much Xanax she could not wake him up. 2 nights ago she was asleep and per patients father he found  patient in his car passed out and was unable to wake him up so he called 911. She is unsure about the instance with the knife. To her knowledge he has not made any statements about being suicidal.   Diagnosis: F33.2 MDD, recurrent, severe   F13.20 Sedative, hypnotic or anxiolytic use disorder, severe  Disposition: Per Arthur Jacksanika Lewis, NP patient meets in patient criteria.  Past Medical History:  Past Medical History:  Diagnosis Date  . Hypertension     Past Surgical History:  Procedure Laterality Date  . CIRCUMCISION      Family History:  Family History  Problem Relation Age of Onset  . Heart attack Maternal Grandfather   . Bipolar disorder Maternal Grandfather   . Schizophrenia Maternal Grandfather   . Stroke Paternal Grandfather   . Depression Mother   . Anxiety disorder Mother   . ADD / ADHD Brother   . Depression Maternal Grandmother   . Anxiety disorder Maternal Grandmother   . Migraines Other     Social History:  reports that he has been smoking cigarettes. He has a 3.50 pack-year smoking history. He has never used smokeless tobacco. He reports current alcohol use. He reports current drug use. Frequency: 7.00 times per week. Drug: Marijuana.  Additional Social History:  Alcohol / Drug Use Pain Medications: see MAR Prescriptions: see MAR Over the Counter: see MAR History of alcohol / drug use?: Yes  CIWA: CIWA-Ar BP: (!) 136/113 Pulse Rate: 90 COWS:    Allergies: No Known Allergies  Home Medications: (  Not in a hospital admission)   OB/GYN Status:  No LMP for male patient.  General Assessment Data Assessment unable to be completed: Yes Reason for not completing assessment: Pt somnolent and unable to participate in assessment. [facility] TTS Assessment: In system Is this a Tele or Face-to-Face Assessment?: Tele Assessment Is this an Initial Assessment or a Re-assessment for this encounter?: Initial Assessment Patient Accompanied by::  Other([employer]) Language Other than English: No Living Arrangements: (father's home) What gender do you identify as?: Male Marital status: Single Maiden name: Earlene PlaterDavis Pregnancy Status: No Living Arrangements: Parent Can pt return to current living arrangement?: Yes Admission Status: Involuntary Petitioner: Family member Is patient capable of signing voluntary admission?: No Referral Source: Self/Family/Friend Insurance type: Medicaid     Crisis Care Plan Living Arrangements: Parent Legal Guardian: (self) Name of Psychiatrist: none Name of Therapist: none  Education Status Is patient currently in school?: No Is the patient employed, unemployed or receiving disability?: Unemployed  Risk to self with the past 6 months Suicidal Ideation: No-Not Currently/Within Last 6 Months Has patient been a risk to self within the past 6 months prior to admission? : Yes Suicidal Intent: No Has patient had any suicidal intent within the past 6 months prior to admission? : No Is patient at risk for suicide?: No Suicidal Plan?: No Has patient had any suicidal plan within the past 6 months prior to admission? : No Access to Means: No What has been your use of drugs/alcohol within the last 12 months?: xanax, percocet, alcohol Previous Attempts/Gestures: No How many times?: 0 Other Self Harm Risks: substance use Triggers for Past Attempts: None known Intentional Self Injurious Behavior: None Family Suicide History: No Recent stressful life event(s): Loss (Comment)(little brother shot) Persecutory voices/beliefs?: No Depression: Yes Depression Symptoms: Despondent, Insomnia, Tearfulness, Isolating, Fatigue, Guilt, Loss of interest in usual pleasures, Feeling worthless/self pity, Feeling angry/irritable Substance abuse history and/or treatment for substance abuse?: No Suicide prevention information given to non-admitted patients: Not applicable  Risk to Others within the past 6 months Homicidal  Ideation: No-Not Currently/Within Last 6 Months Does patient have any lifetime risk of violence toward others beyond the six months prior to admission? : Yes (comment)(has made threats) Thoughts of Harm to Others: No-Not Currently Present/Within Last 6 Months Current Homicidal Intent: No Current Homicidal Plan: No Access to Homicidal Means: No Identified Victim: none ntoed History of harm to others?: No Assessment of Violence: None Noted Violent Behavior Description: "lunged at ED staff" Does patient have access to weapons?: No Criminal Charges Pending?: No Does patient have a court date: No Is patient on probation?: No  Psychosis Hallucinations: None noted Delusions: None noted  Mental Status Report Appearance/Hygiene: In scrubs Eye Contact: Poor Motor Activity: Freedom of movement Speech: Logical/coherent Level of Consciousness: Alert Mood: Depressed Affect: Depressed Anxiety Level: Minimal Thought Processes: Coherent, Relevant Judgement: Impaired Orientation: Person, Place, Time Obsessive Compulsive Thoughts/Behaviors: None  Cognitive Functioning Concentration: Normal Memory: Recent Intact, Remote Intact Is patient IDD: No Insight: Fair Impulse Control: Poor Appetite: Good Have you had any weight changes? : No Change Sleep: Decreased Total Hours of Sleep: (UTA) Vegetative Symptoms: None  ADLScreening [facility] Assessment Services) Patient's cognitive ability adequate to safely complete daily activities?: Yes Patient able to express need for assistance with ADLs?: Yes Independently performs ADLs?: Yes (appropriate for developmental age)  Prior Inpatient Therapy Prior Inpatient Therapy: Yes Prior Therapy Dates: 2016, 2020 [facility] Reason for Treatment: depression  Prior Outpatient Therapy Prior Outpatient Therapy: Yes Prior Therapy Dates: (UTA) Prior Therapy Facilty/Provider(s): (UTA) Reason for Treatment:  depression Does patient have an ACCT team?: No Does patient have Intensive In-House Services?  : No Does patient have [facility] services? : No Does patient have P4CC services?: No  ADL Screening (condition at time of admission) Patient's cognitive ability adequate to safely complete daily activities?: Yes Is the patient deaf or have difficulty hearing?: No Does the patient have difficulty seeing, even when wearing glasses/contacts?: No Does the patient have difficulty concentrating, remembering, or making decisions?: No Patient able to express need for assistance with ADLs?: Yes Does the patient have difficulty dressing or bathing?: No Independently performs ADLs?: Yes (appropriate for developmental age) Does the patient have difficulty walking or climbing stairs?: No Weakness of Legs: None Weakness of Arms/Hands: None  Home Assistive Devices/Equipment Home Assistive Devices/Equipment: None  Therapy Consults (therapy consults require a physician order) PT Evaluation Needed: No OT Evalulation Needed: No SLP Evaluation Needed: No Abuse/Neglect Assessment (Assessment to be complete while patient is alone) Abuse/Neglect Assessment Can Be Completed: Yes Physical Abuse: Denies Verbal Abuse: Denies Sexual Abuse: Denies Exploitation of patient/patient's resources: Denies Self-Neglect: Denies Values / Beliefs Cultural Requests During Hospitalization: None Spiritual Requests During Hospitalization: None Consults Spiritual Care Consult Needed: No Social Work Consult Needed: No Regulatory affairs officer (For Healthcare) Does Patient Have a Medical Advance Directive?: No Would patient like information on creating a medical advance directive?: No - Patient declined          Disposition: Per Ricky Ala, NP patient meets in patient criteria. Disposition Initial Assessment Completed for this Encounter: Yes  This service was provided via telemedicine using a 2-way, interactive audio and  video technology.  Names of all persons participating in this telemedicine service and their role in this encounter. Name: Leighton Ruff Role: patient  Name: Orvis Brill, LCSW Role: TTS  Name:  Role:   Name:  Role:     Orvis Brill 08/30/2018 12:38 PM

## 2018-08-30 NOTE — Progress Notes (Signed)
Pt accepted to [facility], 303-2  Ricky Ala, NP is the accepting provider.  Neita Garnet, MD. is the attending provider.  Call report to [phone]  Laurence A[facility] ED notified.   Pt is IVC  Pt may be transported by [employer] Pt scheduled  to arrive at [id number]   Cristalle [employer], MSW, [place] Disposition Clinical Social Work [phone] (cell) [phone] (office)

## 2018-08-30 NOTE — ED Triage Notes (Signed)
Spoke with Pt and Pt reported the TTS went good. Pt reported the counselor said she was calling his parents. I explained that would take some time . I explained to Pt I would tell him as soon as I was notified .

## 2018-08-30 NOTE — BH Assessment (Signed)
Pt remains somnolent and unable to participate in TTS assessment at this time.   Evelena Peat, [facility], [facility], [facility] Triage Specialist [phone]

## 2018-08-30 NOTE — ED Notes (Signed)
TTS at pt's bedside 

## 2018-08-31 DIAGNOSIS — F322 Major depressive disorder, single episode, severe without psychotic features: Principal | ICD-10-CM

## 2018-08-31 LAB — TSH: TSH: 0.255 u[IU]/mL — ABNORMAL LOW (ref 0.350–4.500)

## 2018-08-31 MED ORDER — NAPROXEN 500 MG PO TABS
500.0000 mg | ORAL_TABLET | Freq: Two times a day (BID) | ORAL | Status: DC | PRN
  Administered 2018-08-31 – 2018-09-02 (×2): 500 mg via ORAL
  Filled 2018-08-31 (×3): qty 1

## 2018-08-31 MED ORDER — METHOCARBAMOL 500 MG PO TABS
500.0000 mg | ORAL_TABLET | Freq: Three times a day (TID) | ORAL | Status: DC | PRN
  Administered 2018-09-01 – 2018-09-02 (×3): 500 mg via ORAL
  Filled 2018-08-31 (×3): qty 1

## 2018-08-31 MED ORDER — CLONIDINE HCL 0.1 MG PO TABS
0.1000 mg | ORAL_TABLET | Freq: Every day | ORAL | Status: DC
  Filled 2018-08-31: qty 1

## 2018-08-31 MED ORDER — DICYCLOMINE HCL 20 MG PO TABS
20.0000 mg | ORAL_TABLET | Freq: Four times a day (QID) | ORAL | Status: DC | PRN
  Administered 2018-09-02: 20 mg via ORAL
  Filled 2018-08-31: qty 1

## 2018-08-31 MED ORDER — CHLORDIAZEPOXIDE HCL 25 MG PO CAPS
25.0000 mg | ORAL_CAPSULE | Freq: Four times a day (QID) | ORAL | Status: DC | PRN
  Administered 2018-09-01: 25 mg via ORAL
  Filled 2018-08-31: qty 1

## 2018-08-31 MED ORDER — CLONIDINE HCL 0.1 MG PO TABS
0.1000 mg | ORAL_TABLET | ORAL | Status: DC
  Administered 2018-09-02: 0.1 mg via ORAL
  Filled 2018-08-31 (×4): qty 1

## 2018-08-31 MED ORDER — MIRTAZAPINE 15 MG PO TABS
15.0000 mg | ORAL_TABLET | Freq: Every day | ORAL | Status: DC
  Administered 2018-08-31 – 2018-09-01 (×2): 15 mg via ORAL
  Filled 2018-08-31 (×4): qty 1

## 2018-08-31 MED ORDER — CLONIDINE HCL 0.1 MG PO TABS
0.1000 mg | ORAL_TABLET | Freq: Four times a day (QID) | ORAL | Status: AC
  Administered 2018-08-31 – 2018-09-01 (×7): 0.1 mg via ORAL
  Filled 2018-08-31 (×7): qty 1

## 2018-08-31 NOTE — H&P (Signed)
Psychiatric Admission Assessment Adult  Patient Identification: Arthur NickelJalen [name] MRN:  [id number] Date of Evaluation:  08/31/2018 Chief Complaint:  Polysubstance Use Disorder MDD Principal Diagnosis: Severe major depression, single episode, without psychotic features (HCC) Diagnosis:  Principal Problem:   Severe major depression, single episode, without psychotic features (HCC) Active Problems:   Anxiety state   Attention deficit hyperactivity disorder (ADHD), combined type   MDD (major depressive disorder)  History of Present Illness: Patient is seen and examined.  Patient is a 18 year old male who was brought to the [facility] [place].  The involuntary commitment paperwork stated the patient was brought in secondary to the fact that the patient was holding a knife and was threatening to kill himself or others.  He admitted to have a history of taking benzodiazepines, opiates including heroin, and as well marijuana.  He had been psychiatrically hospitalized before.  He was seen by Dr. Sharma CovertNorman in April of this year.  He was [place] detox[place] but only remained there 1 week.  The patient stated that he did have a drug problem, and had been using drugs previous to [place], but his brother was shot and killed.  This occurred in January/February of this year.  He stated that after [facility] he remained sober for 2 to 3 weeks, but then contacted police to see how the investigation was going on the death of his brother, and that they had made no progress, and that had caused him to relapse.  He did admit to depressive symptoms as well as not sleeping.  The patient denied using alcohol on a regular basis, but the tele-assessment note did state that he had been using alcohol.  He denied previous symptoms of benzodiazepine withdrawal or opiate withdrawal.   He was admitted to the hospital for evaluation and stabilization.  Associated Signs/Symptoms: Depression Symptoms:  depressed mood, anhedonia, insomnia, psychomotor agitation, fatigue, feelings of worthlessness/guilt, difficulty concentrating, suicidal thoughts without plan, anxiety, loss of energy/fatigue, disturbed sleep, (Hypo) Manic Symptoms:  Distractibility, Impulsivity, Irritable Mood, Anxiety Symptoms:  Excessive Worry, Psychotic Symptoms:  denied PTSD Symptoms: Had a traumatic exposure:  : Brother was murdered in January or February 2020. Total Time spent with patient: 30 minutes  Past Psychiatric History: Patient was last admitted to the psychiatric hospital in April of this year.  He had been placed under involuntary commitment under similar circumstances.  He had also been seen by psychiatry in [facility] emergency department [facility] on 07/22/2014 for suicidal ideation and irritability.  He has been previously diagnosed with bipolar disorder and has been previously prescribed Risperdal as well as Neurontin.  He has a history of polysubstance dependence as well.  Is the patient at risk to self? Yes.    Has the patient been a risk to self in the past 6 months? Yes.    Has the patient been a risk to self within the distant past? Yes.    Is the patient a risk to others? No.  Has the patient been a risk to others in the past 6 months? No.  Has the patient been a risk to others within the distant past? No.   Prior Inpatient Therapy:   Prior Outpatient Therapy:    Alcohol Screening: 1. How often do you have a drink containing alcohol?: Never 2. How many drinks containing alcohol do you have on  a typical day when you are drinking?: 1 or 2 3. How often do you have six or more drinks on one occasion?: Never AUDIT-C Score: 0 4. How often during the last year have you found that you were not able to stop drinking once you had started?: Never 5. How often  during the last year have you failed to do what was normally expected from you becasue of drinking?: Never 6. How often during the last year have you needed a first drink in the morning to get yourself going after a heavy drinking session?: Never 7. How often during the last year have you had a feeling of guilt of remorse after drinking?: Never 8. How often during the last year have you been unable to remember what happened the night before because you had been drinking?: Never 9. Have you or someone else been injured as a result of your drinking?: No 10. Has a relative or friend or a doctor or another health worker been concerned about your drinking or suggested you cut down?: No Alcohol Use Disorder Identification Test Final Score (AUDIT): 0 Substance Abuse History in the last 12 months:  Yes.   Consequences of Substance Abuse: Medical Consequences:  : Suicidal ideation when impaired by substances, at least 2 or 3 psychiatric hospitalizations secondary to this. Previous Psychotropic Medications: Yes  Psychological Evaluations: Yes  Past Medical History:  Past Medical History:  Diagnosis Date  . Hypertension     Past Surgical History:  Procedure Laterality Date  . CIRCUMCISION     Family History:  Family History  Problem Relation Age of Onset  . Heart attack Maternal Grandfather   . Bipolar disorder Maternal Grandfather   . Schizophrenia Maternal Grandfather   . Stroke Paternal Grandfather   . Depression Mother   . Anxiety disorder Mother   . ADD / ADHD Brother   . Depression Maternal Grandmother   . Anxiety disorder Maternal Grandmother   . Migraines Other    Family Psychiatric  History: Bipolar disorder in a grandfather, schizophrenia and a grandfather, depression in mother, anxiety in mother, attention problems and brother. Tobacco Screening:  He does smoke cigarettes. Social History:  Social History   Substance and Sexual Activity  Alcohol Use Not Currently     Social  History   Substance and Sexual Activity  Drug Use Yes  . Frequency: 7.0 times per week  . Types: Marijuana, Heroin    Additional Social History: Marital status: Long term relationship Long term relationship, how long?: 5 years What types of issues is patient dealing with in the relationship?: Substance use Additional relationship information: He has a baby with another woman Are you sexually active?: Yes What is your sexual orientation?: Straight Has your sexual activity been affected by drugs, alcohol, medication, or emotional stress?: No Does patient have children?: Yes How many children?: 1 How is patient's relationship with their children?: 45 year old son, lives with his mom                         Allergies:  No Known Allergies Lab Results:  Results for orders placed or performed during the hospital encounter of 08/30/18 (from the past 48 hour(s))  TSH     Status: Abnormal   Collection Time: 08/31/18  6:55 AM  Result Value Ref Range   TSH 0.255 (L) 0.350 - 4.500 uIU/mL    Comment: Performed by a 3rd Generation assay with a functional sensitivity of <=  0.01 uIU/mL. Performed at [facility], [address] [place], [place] [address]     Blood Alcohol level:  Lab Results  Component Value Date   ETH <10 08/29/2018   ETH <5 02/26/2016    Metabolic Disorder Labs:  No results found for: HGBA1C, MPG No results found for: PROLACTIN Lab Results  Component Value Date   CHOL  10/04/2007    152        ATP III CLASSIFICATION:  <200     mg/dL   Desirable  578-469  mg/dL   Borderline High  >=629    mg/dL   High   TRIG 50 52/84/1324   HDL 54 10/04/2007   CHOLHDL 2.8 10/04/2007   VLDL 10 10/04/2007   LDLCALC  10/04/2007    88        Total Cholesterol/HDL:CHD Risk Coronary Heart Disease Risk Table                     Men   Women  1/2 Average Risk   3.4   3.3    Current Medications: Current Facility-Administered Medications  Medication  Dose Route Frequency Provider Last Rate Last Dose  . acetaminophen (TYLENOL) tablet 650 mg  650 mg Oral Q6H PRN Oneta Rack, NP   650 mg at 08/31/18 0926  . alum & mag hydroxide-simeth (MAALOX/MYLANTA) 200-200-20 MG/5ML suspension 30 mL  30 mL Oral Q4H PRN Oneta Rack, NP      . chlordiazePOXIDE (LIBRIUM) capsule 25 mg  25 mg Oral QID PRN Antonieta Pert, MD      . cloNIDine (CATAPRES) tablet 0.1 mg  0.1 mg Oral QID Antonieta Pert, MD   0.1 mg at 08/31/18 1154   Followed by  . [START ON 09/02/2018] cloNIDine (CATAPRES) tablet 0.1 mg  0.1 mg Oral BH-qamhs Kathrynn Backstrom, Marlane Mingle, MD       Followed by  . [START ON 09/04/2018] cloNIDine (CATAPRES) tablet 0.1 mg  0.1 mg Oral QAC breakfast Antonieta Pert, MD      . dicyclomine (BENTYL) tablet 20 mg  20 mg Oral Q6H PRN Antonieta Pert, MD      . hydrOXYzine (ATARAX/VISTARIL) tablet 25 mg  25 mg Oral TID PRN Jackelyn Poling, NP   25 mg at 08/31/18 0926  . loperamide (IMODIUM) capsule 2-4 mg  2-4 mg Oral PRN Nira Conn A, NP      . magnesium hydroxide (MILK OF MAGNESIA) suspension 30 mL  30 mL Oral Daily PRN Oneta Rack, NP      . methocarbamol (ROBAXIN) tablet 500 mg  500 mg Oral Q8H PRN Antonieta Pert, MD      . mirtazapine (REMERON) tablet 15 mg  15 mg Oral QHS Antonieta Pert, MD      . naproxen (NAPROSYN) tablet 500 mg  500 mg Oral BID PRN Antonieta Pert, MD      . nicotine (NICODERM CQ - dosed in mg/24 hours) patch 21 mg  21 mg Transdermal Daily Cobos, Fernando A, MD      . ondansetron (ZOFRAN-ODT) disintegrating tablet 4 mg  4 mg Oral Q6H PRN Nira Conn A, NP      . traZODone (DESYREL) tablet 50 mg  50 mg Oral QHS PRN Oneta Rack, NP   50 mg at 08/30/18 2149   PTA Medications: No medications prior to admission.    Musculoskeletal: Strength & Muscle Tone: within normal limits Gait &  Station: normal Patient leans: N/A  Psychiatric Specialty Exam: Physical Exam  Nursing note and vitals  reviewed. Constitutional: He is oriented to person, place, and time. He appears well-developed and well-nourished.  HENT:  Head: Normocephalic and atraumatic.  Respiratory: Effort normal.  Neurological: He is alert and oriented to person, place, and time.    ROS  Blood pressure (!) 142/80, pulse 90, temperature 98 F (36.7 C), temperature source Oral, resp. rate 18, height 5\' 8"  (1.727 m), weight 69.9 kg, SpO2 100 %.Body mass index is 23.42 kg/m.  General Appearance: Disheveled  Eye Contact:  Minimal  Speech:  Normal Rate  Volume:  Decreased  Mood:  Depressed and Dysphoric  Affect:  Congruent  Thought Process:  Coherent and Descriptions of Associations: Intact  Orientation:  Full (Time, Place, and Person)  Thought Content:  Logical  Suicidal Thoughts:  Yes.  without intent/plan  Homicidal Thoughts:  No  Memory:  Immediate;   Fair Recent;   Fair Remote;   Fair  Judgement:  Impaired  Insight:  Fair  Psychomotor Activity:  Psychomotor Retardation  Concentration:  Concentration: Fair and Attention Span: Fair  Recall:  [employer]  Fund of Knowledge:  Fair  Language:  Fair  Akathisia:  Negative  Handed:  Right  AIMS (if indicated):     Assets:  Desire for Improvement Resilience  ADL's:  Intact  Cognition:  WNL  Sleep:  Number of Hours: 6.75    Treatment Plan Summary: Daily contact with patient to assess and evaluate symptoms and progress in treatment, Medication management and Plan : Patient is seen and examined.  Patient is a 18 year old male with a past psychiatric history significant for opiate dependence, benzodiazepine dependence, cannabis dependence as well as substance-induced mood disorder and possible depression.  He will be admitted to the hospital.  He will be integrated into the milieu.  He will be encouraged to attend groups.  He will be placed on Librium 25 mg p.o. every 6 hours PRN a CIWA greater than 10.  He will also be placed on the opiate detox protocol.  He will  also be placed on mirtazapine 15 mg p.o. nightly for depression and insomnia.  Review of his laboratories revealed normal electrolytes, a mild anemia, a TSH that is low at 0.255 as well as drug screen positive for benzodiazepines, opiates as well as marijuana.  His EKG showed a sinus rhythm and a QTC that was within normal limits.  We will order T3 and T4 given the low TSH.  He does have charges pending and court dates this month, July as well as August.  Social work will check on those dates in terms of assessing the ability to have a residential substance abuse treatment program or an outpatient subs abuse treatment program.  Observation Level/Precautions:  Detox 15 minute checks  Laboratory:  Chemistry Profile  Psychotherapy:    Medications:    Consultations:    Discharge Concerns:    Estimated LOS:  Other:     Physician Treatment Plan for Primary Diagnosis: Severe major depression, single episode, without psychotic features (HCC) Long Term Goal(s): Improvement in symptoms so as ready for discharge  Short Term Goals: Ability to identify changes in lifestyle to reduce recurrence of condition will improve, Ability to verbalize feelings will improve, Ability to disclose and discuss suicidal ideas, Ability to demonstrate self-control will improve, Ability to identify and develop effective coping behaviors will improve, Ability to maintain clinical measurements within normal limits will improve and Ability to  identify triggers associated with substance abuse/mental health issues will improve  Physician Treatment Plan for Secondary Diagnosis: Principal Problem:   Severe major depression, single episode, without psychotic features (HCC) Active Problems:   Anxiety state   Attention deficit hyperactivity disorder (ADHD), combined type   MDD (major depressive disorder)  Long Term Goal(s): Improvement in symptoms so as ready for discharge  Short Term Goals: Ability to identify changes in lifestyle  to reduce recurrence of condition will improve, Ability to verbalize feelings will improve, Ability to disclose and discuss suicidal ideas, Ability to demonstrate self-control will improve, Ability to identify and develop effective coping behaviors will improve, Ability to maintain clinical measurements within normal limits will improve and Ability to identify triggers associated with substance abuse/mental health issues will improve  I certify that inpatient services furnished can reasonably be expected to improve the patient's condition.    Antonieta PertGreg Lawson Laxmi Choung, MD 6/30/20202:43 PM

## 2018-08-31 NOTE — Progress Notes (Signed)
Patient rated his day as a 6 out of a possible 10 because he did not feel well throughout the day. He did not go into further detail. His goal for tomorrow is to have a better day than today.

## 2018-08-31 NOTE — Plan of Care (Signed)
Nurse discussed anxiety, depression and hopeless with patient.  

## 2018-08-31 NOTE — Progress Notes (Signed)
D:  Patient's self inventory sheet, patient has poor sleep, sleep medication not helpful.  Fair appetite, low energy level, poor concentration.  Rate depression, hopeless and anxiety #6.  Withdrawals, diarrhea.  Denied SI.  Denied physical problems.  Physical pain, worst pain #5 in past 24 hours.  Goal is myself.  Plans to work on it.  No discharge plans. A:  Medications administered per MD orders.  Emotional support and encouragement given patient. R:  Denied SI and HI, contracts for safety.  Denied A/V hallucinations.  Safety maintained with 15 minute checks.

## 2018-08-31 NOTE — [facility] Suicide Risk Assessment (Signed)
[facility]   Nursing information obtained from:  Patient Demographic factors:  Male, Low socioeconomic status, Adolescent or young adult Current Mental Status:  Suicidal ideation indicated by patient, Self-harm thoughts Loss Factors:  Decline in physical health Historical Factors:  Impulsivity Risk Reduction Factors:  Positive coping skills or problem solving skills, Sense of responsibility to family  Total Time spent with patient: 30 minutes Principal Problem: <principal problem not specified> Diagnosis:  Active Problems:   MDD (major depressive disorder)  Subjective Data: Patient is seen and examined.  Patient is a 18 year old male who was brought to the [facility] [place] [facility].  The involuntary commitment paperwork stated the patient was brought in secondary to the fact that the patient was holding a knife and was threatening to kill himself or others.  He admitted to have a history of taking benzodiazepines, opiates including heroin, and as well marijuana.  He had been psychiatrically hospitalized before.  He was seen by Dr. Sharma CovertNorman in April of this year.  He was [place] detox[place], [place] but only remained there 1 week.  The patient stated that he did have a drug problem, and had been using drugs previous to [place], but his brother was shot and killed.  This occurred in January/February of this year.  He stated that after he got [facility] he remained sober for 2 to 3 weeks, but then contacted police to see how the investigation was going on the death of his brother, and that they had made no progress, and that had caused him to relapse.  He did admit to depressive symptoms as well as not sleeping.  The patient denied using alcohol on a regular basis, but the tele-assessment note did state that he had been using alcohol.  He denied  previous symptoms of benzodiazepine withdrawal or opiate withdrawal.  He was admitted to the hospital for evaluation and stabilization.  Continued Clinical Symptoms:  Alcohol Use Disorder Identification Test Final Score (AUDIT): 0 The "Alcohol Use Disorders Identification Test", Guidelines for Use in Primary Care, Second Edition.  World Science writerHealth [employer] [facility](WHO). Score between 0-7:  no or low risk or alcohol related problems. Score between 8-15:  moderate risk of alcohol related problems. Score between 16-19:  high risk of alcohol related problems. Score 20 or above:  warrants further diagnostic evaluation for alcohol dependence and treatment.   CLINICAL FACTORS:   Depression:   Anhedonia Comorbid alcohol abuse/dependence Hopelessness Impulsivity Insomnia Alcohol/Substance Abuse/Dependencies   Musculoskeletal: Strength & Muscle Tone: within normal limits Gait & Station: normal Patient leans: N/A  Psychiatric Specialty Exam: Physical Exam  Nursing note and vitals reviewed. Constitutional: He is oriented to person, place, and time. He appears well-developed and well-nourished.  HENT:  Head: Normocephalic and atraumatic.  Respiratory: Effort normal.  Neurological: He is alert and oriented to person, place, and time.    ROS  Blood pressure 112/77, pulse 97, temperature 98 F (36.7 C), temperature source Oral, resp. rate 18, height 5\' 8"  (1.727 m), weight 69.9 kg, SpO2 100 %.Body mass index is 23.42 kg/m.  General Appearance: Disheveled  Eye Contact:  Poor  Speech:  Normal Rate  Volume:  Decreased  Mood:  Depressed and Dysphoric  Affect:  Congruent  Thought Process:  Coherent and Descriptions of Associations: Circumstantial  Orientation:  Full (Time, Place, and Person)  Thought Content:  Logical  Suicidal Thoughts:  No  Homicidal Thoughts:  No  Memory:  Immediate;   Fair Recent;   Fair Remote;   Fair  Judgement:  Impaired  Insight:  Fair  Psychomotor Activity:   Psychomotor Retardation  Concentration:  Concentration: Fair and Attention Span: Fair  Recall:  [employer] of Knowledge:  Fair  Language:  Fair  Akathisia:  Negative  Handed:  Right  AIMS (if indicated):     Assets:  Desire for Improvement Resilience  ADL's:  Intact  Cognition:  WNL  Sleep:  Number of Hours: 6.75      COGNITIVE FEATURES THAT CONTRIBUTE TO RISK:  None    SUICIDE RISK:   Minimal: No identifiable suicidal ideation.  Patients presenting with no risk factors but with morbid ruminations; may be classified as minimal risk based on the severity of the depressive symptoms  PLAN OF CARE: Patient is seen and examined.  Patient is a 18 year old male with a past psychiatric history significant for opiate dependence, benzodiazepine dependence, cannabis dependence as well as substance-induced mood disorder and possible depression.  He will be admitted to the hospital.  He will be integrated into the milieu.  He will be encouraged to attend groups.  He will be placed on Librium 25 mg p.o. every 6 hours PRN a CIWA greater than 10.  He will also be placed on the opiate detox protocol.  He will also be placed on mirtazapine 15 mg p.o. nightly for depression and insomnia.  Review of his laboratories revealed normal electrolytes, a mild anemia, a TSH that is low at 0.255 as well as drug screen positive for benzodiazepines, opiates as well as marijuana.  His EKG showed a sinus rhythm and a QTC that was within normal limits.  We will order T3 and T4 given the low TSH.  He does have charges pending and court dates this month, July as well as August.  Social work will check on those dates in terms of assessing the ability to have a residential substance abuse treatment program or an outpatient subs abuse treatment program.  I certify that inpatient services furnished can reasonably be expected to improve the patient's condition.   Sharma Covert, MD 08/31/2018, 10:04 AM

## 2018-08-31 NOTE — H&P (Signed)
Psychiatric Admission Assessment Adult  Patient Identification: Arthur Martinez MRN:  [id number] Date of Evaluation:  08/31/2018 Chief Complaint:  Polysubstance Use Disorder MDD Principal Diagnosis: Severe major depression, single episode, without psychotic features (HCC) Diagnosis:  Principal Problem:   Severe major depression, single episode, without psychotic features (HCC) Active Problems:   Anxiety state   Attention deficit hyperactivity disorder (ADHD), combined type   MDD (major depressive disorder)  Subjective Data: Patient is seen and examined.  Patient is a 18 year old male who was brought to the [facility] emergency department via [facility] police under involuntary commitment.  The involuntary commitment paperwork stated the patient was brought in secondary to the fact that the patient was holding a knife and was threatening to kill himself or others.  He admitted to have a history of taking benzodiazepines, opiates including heroin, and as well marijuana.  He had been psychiatrically hospitalized before.  He was seen by Dr. Sharma Covert in April of this year.  He was discharged to a detox/rehab place in [place], [place] but only remained there 1 week.  The patient stated that he did have a drug problem, and had been using drugs previous to [place] of this year, but his brother was shot and killed.  This occurred in January/February of this year.  He stated that after he got out of the [facility] facility he remained sober for 2 to 3 weeks, but then contacted police to see how the investigation was going on the death of his brother, and that they had made no progress, and that had caused him to relapse.  He did admit to depressive symptoms as well as not sleeping.  The patient denied using alcohol on a regular basis, but the tele-assessment note did state that he had been using alcohol.  He denied previous symptoms of benzodiazepine withdrawal or opiate withdrawal.  He was  admitted to the hospital for evaluation and stabilization.  From TTS assessment : Arthur Martinez is an 18 y.o. male presenting to [facility] ED via [employer] under IVC. Per EDP note: "Patient was brought in by [facility] after he was involuntarily committed by his father. Father stated that patient was holding a knife and threatening to kill himself or hurt others. He admits to taking Xanax pills and using marijuana and alcohol. Father reported that he has been taking Percocets and heroin. He has history of psychiatric hospitalization." Patient was combative upon arrival to ED. He was lunging at staff, attempting to elope, and required sedation. Patient not seen until following afternoon.  Upon this clinician's exam patient is calm and cooperative. When asked why he is in the hospital he states "I'm having some emotional problems since my little brother died. He was shot in 03-05-22. He was 17." Patient admits to using Xanax the previous day "trying to get rid of the pain." Patient denies any other substance use and UDS has not been collected at time of assessment. Per IVC and chart review patient is also using alcohol, THC, and opiates. Patient denies SI/HI/AVH. Patient recalls an argument with family the previous day but does not recall specifics. Patient does not have any outpatient resources but states he used to see a therapist and he could go back to that one. Patient gave verbal consent for TTS to contact his father, Arthur Martinez [phone]) and girlfriend, Arthur Martinez [phone]). Since patient lives with father, this clinician attempted to reach him however he did not answer and he mailbox was full.   Per Arthur Martinez: Patient has  been abusing Xanax bars. She states he was at his house 3 nights ago and he had used so much Xanax she could not wake him up. 2 nights ago she was asleep and per patients father he found patient in his car passed out and was unable to wake him up so he called 911. She is unsure about the  instance with the knife. To her knowledge he has not made any statements about being suicidal.   On exam: Patient is alert and oriented x 4, pleasant, and cooperative. Appears drowsy, states that he is just tired. Speech is clear and coherent, normal pace, low in volume. Mood is anxious and depressed. Affect is congruent with mood. Denies suicidal ideations, homicidal ideations, and audiovisual hallucinations.   Associated Signs/Symptoms: Depression Symptoms:  depressed mood, feelings of worthlessness/guilt, difficulty concentrating, anxiety, loss of energy/fatigue, (Hypo) Manic Symptoms:  Impulsivity, Irritable Mood, Anxiety Symptoms:  general anxiety Psychotic Symptoms:  Denies PTSD Symptoms: Negative Total Time spent with patient: 30 minutes  Past Psychiatric History:   On chart review it was noted that patient was seen by psychiatry in the ED at [facility] on 07/22/2014 for SI and irritability. He was diagnosed as Bipolar Disorder, mixed, without psychosis. At that time he was prescribed Risperdal and Neurontin.   Is the patient at risk to self? Yes.    Has the patient been a risk to self in the past 6 months? No.  Has the patient been a risk to self within the distant past? Yes.    Is the patient a risk to others? No.  Has the patient been a risk to others in the past 6 months? No.  Has the patient been a risk to others within the distant past? No.   Prior Inpatient Therapy:   On chart review it was noted that patient was seen by psychiatry in [facility] ED at [facility] on 07/22/2014 for SI and irritability, and was admitted to our observation unit 06/2018 for the like.   Prior Outpatient Therapy:    Alcohol Screening: 1. How often do you have a drink containing alcohol?: Never 2. How many drinks containing alcohol do you have on a typical day when you are drinking?: 1 or 2 3. How often do you have six or more drinks on one occasion?: Never AUDIT-C Score: 0 4. How often during the last  year have you found that you were not able to stop drinking once you had started?: Never 5. How often during the last year have you failed to do what was normally expected from you becasue of drinking?: Never 6. How often during the last year have you needed a first drink in the morning to get yourself going after a heavy drinking session?: Never 7. How often during the last year have you had a feeling of guilt of remorse after drinking?: Never 8. How often during the last year have you been unable to remember what happened the night before because you had been drinking?: Never 9. Have you or someone else been injured as a result of your drinking?: No 10. Has a relative or friend or a doctor or another health worker been concerned about your drinking or suggested you cut down?: No Alcohol Use Disorder Identification Test Final Score (AUDIT): 0 Substance Abuse History in the last 12 months:  Yes.   Consequences of Substance Abuse: Negative Previous Psychotropic Medications: Yes  Psychological Evaluations: Yes  Past Medical History:  Past Medical History:  Diagnosis Date  .  Hypertension     Past Surgical History:  Procedure Laterality Date  . CIRCUMCISION     Family History:  Family History  Problem Relation Age of Onset  . Heart attack Maternal Grandfather   . Bipolar disorder Maternal Grandfather   . Schizophrenia Maternal Grandfather   . Stroke Paternal Grandfather   . Depression Mother   . Anxiety disorder Mother   . ADD / ADHD Brother   . Depression Maternal Grandmother   . Anxiety disorder Maternal Grandmother   . Migraines Other    Family Psychiatric History: Mother Anxiety and Depression, MGF-Bipolar and schixophrenia, Brother -ADHD, MGM Depression and Anxiety Tobacco Screening:   Social History:  Social History   Substance and Sexual Activity  Alcohol Use Not Currently     Social History   Substance and Sexual Activity  Drug Use Yes  . Frequency: 7.0 times per  week  . Types: Marijuana, Heroin    Additional Social History:       Allergies:  No Known Allergies Lab Results:  Results for orders placed or performed during the hospital encounter of 08/30/18 (from the past 48 hour(s))  TSH     Status: Abnormal   Collection Time: 08/31/18  6:55 AM  Result Value Ref Range   TSH 0.255 (L) 0.350 - 4.500 uIU/mL    Comment: Performed by a 3rd Generation assay with a functional sensitivity of <=0.01 uIU/mL. Performed at [facility], [address] [place], [place] [address]     Blood Alcohol level:  Lab Results  Component Value Date   ETH <10 08/29/2018   ETH <5 02/26/2016    Metabolic Disorder Labs:  No results found for: HGBA1C, MPG No results found for: PROLACTIN Lab Results  Component Value Date   CHOL  10/04/2007    152        ATP III CLASSIFICATION:  <200     mg/dL   Desirable  [id number]  mg/dL   Borderline High  >=981>=240    mg/dL   High   TRIG 50 19/14/782908/05/2007   HDL 54 10/04/2007   CHOLHDL 2.8 10/04/2007   VLDL 10 10/04/2007   LDLCALC  10/04/2007    88        Total Cholesterol/HDL:CHD Risk Coronary Heart Disease Risk Table                     Men   Women  1/2 Average Risk   3.4   3.3    Current Medications: Current Facility-Administered Medications  Medication Dose Route Frequency Provider Last Rate Last Dose  . acetaminophen (TYLENOL) tablet 650 mg  650 mg Oral Q6H PRN Oneta RackLewis, Tanika N, NP   650 mg at 08/31/18 0926  . alum & mag hydroxide-simeth (MAALOX/MYLANTA) 200-200-20 MG/5ML suspension 30 mL  30 mL Oral Q4H PRN Oneta RackLewis, Tanika N, NP      . chlordiazePOXIDE (LIBRIUM) capsule 25 mg  25 mg Oral QID PRN Antonieta Pertlary, Greg Lawson, MD      . cloNIDine (CATAPRES) tablet 0.1 mg  0.1 mg Oral QID Antonieta Pertlary, Greg Lawson, MD   0.1 mg at 08/31/18 1154   Followed by  . [START ON 09/02/2018] cloNIDine (CATAPRES) tablet 0.1 mg  0.1 mg Oral BH-qamhs Clary, Marlane MingleGreg Lawson, MD       Followed by  . [START ON 09/04/2018] cloNIDine (CATAPRES)  tablet 0.1 mg  0.1 mg Oral QAC breakfast Antonieta Pertlary, Greg Lawson, MD      . dicyclomine (BENTYL)  tablet 20 mg  20 mg Oral Q6H PRN Sharma Covert, MD      . hydrOXYzine (ATARAX/VISTARIL) tablet 25 mg  25 mg Oral TID PRN Rozetta Nunnery, NP   25 mg at 08/31/18 0926  . loperamide (IMODIUM) capsule 2-4 mg  2-4 mg Oral PRN Lindon Romp A, NP      . magnesium hydroxide (MILK OF MAGNESIA) suspension 30 mL  30 mL Oral Daily PRN Derrill Center, NP      . methocarbamol (ROBAXIN) tablet 500 mg  500 mg Oral Q8H PRN Sharma Covert, MD      . mirtazapine (REMERON) tablet 15 mg  15 mg Oral QHS Sharma Covert, MD      . naproxen (NAPROSYN) tablet 500 mg  500 mg Oral BID PRN Sharma Covert, MD      . nicotine (NICODERM CQ - dosed in mg/24 hours) patch 21 mg  21 mg Transdermal Daily Cobos, Fernando A, MD      . ondansetron (ZOFRAN-ODT) disintegrating tablet 4 mg  4 mg Oral Q6H PRN Lindon Romp A, NP      . traZODone (DESYREL) tablet 50 mg  50 mg Oral QHS PRN Derrill Center, NP   50 mg at 08/30/18 2149   PTA Medications: No medications prior to admission.    Musculoskeletal: Strength & Muscle Tone: within normal limits Gait & Station: normal   Psychiatric Specialty Exam: Physical Exam  Constitutional: He is oriented to person, place, and time. He appears well-developed and well-nourished. No distress.  HENT:  Head: Normocephalic and atraumatic.  Right Ear: External ear normal.  Left Ear: External ear normal.  Eyes: Pupils are equal, round, and reactive to light. Conjunctivae are normal. Right eye exhibits no discharge. Left eye exhibits no discharge. No scleral icterus.  Respiratory: Effort normal. No respiratory distress.  Musculoskeletal: Normal range of motion.  Neurological: He is alert and oriented to person, place, and time.  Skin: He is not diaphoretic.  Psychiatric: His mood appears anxious. He is not withdrawn and not actively hallucinating. Thought content is not paranoid and not  delusional. He expresses impulsivity and inappropriate judgment. He exhibits a depressed mood. He expresses no homicidal and no suicidal ideation.    Review of Systems  Constitutional: Negative for chills, diaphoresis, fever, malaise/fatigue and weight loss.  Respiratory: Negative for cough and shortness of breath.   Gastrointestinal: Negative for diarrhea, nausea and vomiting.  Psychiatric/Behavioral: Positive for depression and substance abuse. Negative for hallucinations, memory loss and suicidal ideas. The patient is nervous/anxious. The patient does not have insomnia.     Blood pressure (!) 142/80, pulse 90, temperature 98 F (36.7 C), temperature source Oral, resp. rate 18, height 5\' 8"  (1.727 m), weight 69.9 kg, SpO2 100 %.Body mass index is 23.42 kg/m.  General Appearance: Casual and Disheveled  Eye Contact:  Fair  Speech:  Clear and Coherent and Normal Rate  Volume:  Decreased  Mood:  Anxious and Depressed  Affect:  Congruent and Depressed  Thought Process:  Coherent and Descriptions of Associations: Intact  Orientation:  Full (Time, Place, and Person)  Thought Content:  Logical and Hallucinations: None  Suicidal Thoughts:  No  Homicidal Thoughts:  No  Memory:  Immediate;   Fair Recent;   Fair  Judgement:  Fair  Insight:  Fair  Psychomotor Activity:  Normal  Concentration:  Concentration: Fair and Attention Span: Fair  Recall:  [employer] of Knowledge:  Good  Language:  Good  Akathisia:  Negative  Handed:  Right  AIMS (if indicated):     Assets:  Communication Skills Desire for Improvement Financial Resources/Insurance Housing Intimacy Leisure Time Physical Health  ADL's:  Intact  Cognition:  WNL  Sleep:  Number of Hours: 6.75      Treatment Plan Summary: Daily contact with patient to assess and evaluate symptoms and progress in treatment and Plan observation and review of IVC in the am. Patient is seen and examined.  Patient is a [age] year old male with a  past psychiatric history significant for opiate dependence, benzodiazepine dependence, cannabis dependence as well as substance-induced mood disorder and possible depression.  He will be admitted to the hospital.  He will be integrated into the milieu.  He will be encouraged to attend groups.  He will be placed on Librium 25 mg p.o. every 6 hours PRN a CIWA greater than 10.  He will also be placed on the opiate detox protocol.  He will also be placed on mirtazapine 15 mg p.o. nightly for depression and insomnia.  Review of his laboratories revealed normal electrolytes, a mild anemia, a TSH that is low at 0.255 as well as drug screen positive for benzodiazepines, opiates as well as marijuana.  His EKG showed a sinus rhythm and a QTC that was within normal limits.  We will order T3 and T4 given the low TSH.  He does have charges pending and court dates this month, July as well as August.  Social work will check on those dates in terms of assessing the ability to have a residential substance abuse treatment program or an outpatient subs abuse treatment program.  Observation Level/Precautions:  15 minute checks Laboratory:  CBC Chemistry Profile UDS TSH Psychotherapy:  individual Medications:  Vistaril 25 mg prn anxiety, Librium detox Consultations: as needed Discharge Concerns:    Estimated LOS: 3-5 days Other:      Maryagnes Amosakia S Starkes-Perry, FNP 6/30/202012:08 PM

## 2018-08-31 NOTE — Progress Notes (Signed)
Pt isolated to his room   He came to get medications and said he was having some withdrawal and felt anxious   Pt received medications and is presently safe  [place] NOVEL CORONAVIRUS (COVID-19) DAILY CHECK-OFF SYMPTOMS - answer yes or no to each - every day NO YES  Have you had a fever in the past 24 hours?  . Fever (Temp > 37.80C / 100F) X   Have you had any of these symptoms in the past 24 hours? . New Cough .  Sore Throat  .  Shortness of Breath .  Difficulty Breathing .  Unexplained Body Aches   X   Have you had any one of these symptoms in the past 24 hours not related to allergies?   . Runny Nose .  Nasal Congestion .  Sneezing   X   If you have had runny nose, nasal congestion, sneezing in the past 24 hours, has it worsened?  X   EXPOSURES - check yes or no X   Have you traveled outside the state in the past 14 days?  X   Have you been in contact with someone with a confirmed diagnosis of COVID-19 or PUI in the past 14 days without wearing appropriate PPE?  X   Have you been living in the same home as a person with confirmed diagnosis of COVID-19 or a PUI (household contact)?    X   Have you been diagnosed with COVID-19?    X              What to do next: Answered NO to all: Answered YES to anything:   Proceed with unit schedule Follow the [facility] Inpatient Flowsheet.

## 2018-08-31 NOTE — [facility] Counselor (Signed)
Adult Comprehensive Assessment  Patient ID: Arthur Martinez, male   DOB: 07-Oct-2000, 18 y.o.   MRN: [id number]  Information Source: Information source: Patient  Current Stressors:  Patient states their primary concerns and needs for treatment are:: Reports he had a breakdown after found out there are no updates regarding the investigation of his brother's murder. He says he took Xanax and doesn't remember anything. Per chart review, patient was IVC'd by his father because he was holding a knife and threatening to kill himself and others. Patient states their goals for this hospitilization and ongoing recovery are:: "Get through the next couple of days." Educational / Learning stressors: Denies Employment / Job issues: Works through a [employer] agency, no complaints Family Relationships: Strained with mom and dad. Younger brother was murdered recently. Financial / Lack of resources (include bankruptcy): Limited income, has [employer] Housing / Lack of housing: Stays between his dad and his girlfriend's house. Physical health (include injuries & life threatening diseases): Denies, reports he has some trouble sleeping Social relationships: Has a "rocky" relationship with his GF of 5 years, no real friends. Substance abuse: Smokes marijuana daily, "sometimes" uses heroin by snorting it. Denies regular use of Xanax Bereavement / Loss: 20 year old brother was shot to death recently. He is having a hard time with this loss, especially as the case in unsolved and there are no leads. No grief counseling but he is agreeable to referrals.  Living/Environment/Situation:  Living Arrangements: Other (Comment) Living conditions (as described by patient or guardian): Stays between his dad's house and his girlfriend's place. Both in [place] Who else lives in the home?: Dad, GF How long has patient lived in current situation?: A couple of years What is atmosphere in current home: Temporary  Family History:   Marital status: Long term relationship Long term relationship, how long?: 5 years What types of issues is patient dealing with in the relationship?: Substance use Additional relationship information: He has a baby with another woman Are you sexually active?: Yes What is your sexual orientation?: Straight Has your sexual activity been affected by drugs, alcohol, medication, or emotional stress?: No Does patient have children?: Yes How many children?: 1 How is patient's relationship with their children?: 20 year old son, lives with his mom  Childhood History:  By whom was/is the patient raised?: Mother, Father Additional childhood history information: Mom and dad weren't together, shared custody. Dad lived in [place] and mom lived in [place]. Description of patient's relationship with caregiver when they were a child: Fine Patient's description of current relationship with people who raised him/her: Strained How were you disciplined when you got in trouble as a child/adolescent?: Appropriately Does patient have siblings?: Yes Number of Siblings: 1 Description of patient's current relationship with siblings: 1 younger brother- deceased Did patient suffer any verbal/emotional/physical/sexual abuse as a child?: No Did patient suffer from severe childhood neglect?: No Has patient ever been sexually abused/assaulted/raped as an adolescent or adult?: No Was the patient ever a victim of a crime or a disaster?: No Witnessed domestic violence?: No Has patient been effected by domestic violence as an adult?: No  Education:  Highest grade of school patient has completed: 10th grade Currently a student?: No Learning disability?: No  Employment/Work Situation:   Employment situation: Employed Where is patient currently employed?: [employer]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 How long has patient been employed?: 1 year Patient's job has been impacted by current illness: No What is the longest time patient has a held a job?:  3-4  years 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 Used xanax prior to admission, denies regular use. If attempted suicide, did drugs/alcohol play a role in this?: (Denies suicide attempt) Alcohol/Substance Abuse Treatment Hx: Past detox If yes, describe treatment: Admitted to [facility] on the OBS unit in May 2020. Went to "detox" at [facility] afterwards. Has alcohol/substance abuse ever caused legal problems?: (Has multiple court dates, including felony charges for July and August 2020)  Social Support System:   Patient's Community Support System: Poor Describe Community Support System: Dad, mom, GF Type of faith/religion: Darrick Meigs How does patient's faith help to cope with current illness?: Not sure  Leisure/Recreation:   Leisure and Hobbies: Watch TV, play on phone  Strengths/Needs:   What is the patient's perception of their strengths?: Good at football and wrestling Patient states they can use these personal strengths during their treatment to contribute to their recovery: Not sure Patient states these barriers may affect/interfere with their treatment: Denies Patient states these barriers may affect their return to the community: Denies  Discharge Plan:   Currently receiving community mental health services: No Patient states concerns and preferences for aftercare planning are: Agreeable to outpatient follow up, will be referred to [facility]. CSW will provide patient with grief counseling resources as well. Patient states they will know when they are  safe and ready for discharge when: Wants to leave ASAP Does patient have access to transportation?: Yes Does patient have financial barriers related to discharge medications?: No Patient description of barriers related to discharge medications: Has Medicaid. Will patient be returning to same living situation after discharge?: Yes(Says he will probably stay with his dad.)  Summary/Recommendations:   Summary and Recommendations (to be completed by the evaluator): Sylvan is a 18 year old male [facility] He reports he had a breakdown after found out there are no updates regarding the investigation of his brother's murder. He says he took Xanax and doesn't remember anything. Per chart review, patient was IVC'd by his father because he was holding a knife and threatening to kill himself and others. Patient endorses daily THC use and "sometimes" uses heroin. He has several court dates in the next 2 months. He was at [facility] in May 2020 on the OBS unit. He did not follow up with outpatient. Patient will benefit from crisis stabilization, medication management, therapeutic miliue, and referral for services.  Joellen Jersey. 08/31/2018

## 2018-08-31 NOTE — [facility] Suicide Risk Assessment (Signed)
[place] INPATIENT:  Family/Significant Other Suicide Prevention Education  Suicide Prevention Education:  Patient Refusal for Family/Significant Other Suicide Prevention Education: The patient Arthur Martinez has refused to provide written consent for family/significant other to be provided Family/Significant Other Suicide Prevention Education during admission and/or prior to discharge.  Physician notified.  Joellen Jersey 08/31/2018, 1:20 PM

## 2018-09-01 MED ORDER — TRAZODONE HCL 100 MG PO TABS
100.0000 mg | ORAL_TABLET | Freq: Every evening | ORAL | Status: DC | PRN
  Administered 2018-09-01: 100 mg via ORAL
  Filled 2018-09-01: qty 1

## 2018-09-01 NOTE — Progress Notes (Signed)
Recreation Therapy Notes  Date:  7.1.20 Time: 0930 Location: 300 [address] Dayroom  Group Topic: Stress Management  Goal Area(s) Addresses:  Patient will identify positive stress management techniques. Patient will identify benefits of using stress management post d/c.  Behavioral Response: Engaged  Intervention:  Stress Management  Activity :  Guided Imagery.  LRT introduced the stress management technique of guided imagery.  LRT read a script that lead patients [place] the beach.  Patients were to listen and follow along as LRT read script.  Education:  Stress Management, Discharge Planning.   Education Outcome: Acknowledges Education  Clinical Observations/Feedback:  Pt attended and participated in group.    Victorino Sparrow, LRT/CTRS    Ria Comment, Jakira Mcfadden A 09/01/2018 11:02 AM

## 2018-09-01 NOTE — Progress Notes (Signed)
[facility]  09/01/2018 12:22 PM Arthur NickelJalen [name]  MRN:  [id number] Subjective:  Patient is seen and examined. Patient is a 18 year old male who [facility] [place] [facility] [place]. The involuntary commitment paperwork stated the patient was brought in secondary to the fact that the patient was holding a knife and was threatening to kill himself or others. He admitted to have a history of taking benzodiazepines, opiates including heroin, and as well marijuana.  Objective: Patient is seen and examined.  Patient is 18 year old male with the above-stated past psychiatric history seen in follow-up.  He stated he is not feeling very well today.  He stated that he is having withdrawal symptoms.  He is remained in the bed most the day today.  He denied any suicidal ideation.  We also discussed outpatient substance abuse treatment after his discharge.  We discussed the fact that with 3 court dates and charges pending most likely he would not be eligible to get into a residential treatment program until those issues had been resolved.  His vital signs are stable, he is afebrile.  His CIWA was only for this a.m.  He stated he was not sleeping well.  Principal Problem: Severe major depression, single episode, without psychotic features (HCC) Diagnosis: Principal Problem:   Severe major depression, single episode, without psychotic features (HCC) Active Problems:   Anxiety state   Attention deficit hyperactivity disorder (ADHD), combined type   MDD (major depressive disorder)  Total Time spent with patient: 15 minutes  Past Psychiatric History: See admission H&P  Past Medical History:  Past Medical History:  Diagnosis Date  . Hypertension     Past Surgical History:  Procedure Laterality Date  . CIRCUMCISION     Family History:  Family History  Problem Relation Age of Onset  . Heart attack Maternal Grandfather   .  Bipolar disorder Maternal Grandfather   . Schizophrenia Maternal Grandfather   . Stroke Paternal Grandfather   . Depression Mother   . Anxiety disorder Mother   . ADD / ADHD Brother   . Depression Maternal Grandmother   . Anxiety disorder Maternal Grandmother   . Migraines Other    Family Psychiatric  History: See admission H&P Social History:  Social History   Substance and Sexual Activity  Alcohol Use Not Currently     Social History   Substance and Sexual Activity  Drug Use Yes  . Frequency: 7.0 times per week  . Types: Marijuana, Heroin    Social History   Socioeconomic History  . Marital status: Single    Spouse name: Not on file  . Number of children: Not on file  . Years of education: Not on file  . Highest education level: Not on file  Occupational History  . Not on file  Social Needs  . Financial resource strain: Not on file  . Food insecurity    Worry: Not on file    Inability: Not on file  . Transportation needs    Medical: Not on file    Non-medical: Not on file  Tobacco Use  . Smoking status: Current Every Day Smoker    Packs/day: 1.00    Years: 7.00    Pack years: 7.00    Types: Cigarettes  . Smokeless tobacco: Never Used  . Tobacco comment: Tried smoking and dipping once  Substance and Sexual Activity  . Alcohol use: Not Currently  . Drug use: Yes  Frequency: 7.0 times per week    Types: Marijuana, Heroin  . Sexual activity: Yes    Birth control/protection: Condom  Lifestyle  . Physical activity    Days per week: Not on file    Minutes per session: Not on file  . Stress: Not on file  Relationships  . Social Herbalist on phone: Not on file    Gets together: Not on file    Attends religious service: Not on file    Active member of club or organization: Not on file    Attends meetings of clubs or organizations: Not on file    Relationship status: Not on file  Other Topics Concern  . Not on file  Social History Narrative   . Not on file   Additional Social History:                         Sleep: Fair  Appetite:  Fair  Current Medications: Current Facility-Administered Medications  Medication Dose Route Frequency Provider Last Rate Last Dose  . acetaminophen (TYLENOL) tablet 650 mg  650 mg Oral Q6H PRN Derrill Center, NP   650 mg at 08/31/18 0926  . alum & mag hydroxide-simeth (MAALOX/MYLANTA) 200-200-20 MG/5ML suspension 30 mL  30 mL Oral Q4H PRN Derrill Center, NP      . chlordiazePOXIDE (LIBRIUM) capsule 25 mg  25 mg Oral QID PRN Sharma Covert, MD      . cloNIDine (CATAPRES) tablet 0.1 mg  0.1 mg Oral QID Sharma Covert, MD   0.1 mg at 09/01/18 1206   Followed by  . [START ON 09/02/2018] cloNIDine (CATAPRES) tablet 0.1 mg  0.1 mg Oral BH-qamhs Zaim Nitta, Cordie Grice, MD       Followed by  . [START ON 09/04/2018] cloNIDine (CATAPRES) tablet 0.1 mg  0.1 mg Oral QAC breakfast Sharma Covert, MD      . dicyclomine (BENTYL) tablet 20 mg  20 mg Oral Q6H PRN Sharma Covert, MD      . hydrOXYzine (ATARAX/VISTARIL) tablet 25 mg  25 mg Oral TID PRN Rozetta Nunnery, NP   25 mg at 08/31/18 0926  . loperamide (IMODIUM) capsule 2-4 mg  2-4 mg Oral PRN Lindon Romp A, NP      . magnesium hydroxide (MILK OF MAGNESIA) suspension 30 mL  30 mL Oral Daily PRN Derrill Center, NP      . methocarbamol (ROBAXIN) tablet 500 mg  500 mg Oral Q8H PRN Sharma Covert, MD   500 mg at 09/01/18 0744  . mirtazapine (REMERON) tablet 15 mg  15 mg Oral QHS Sharma Covert, MD   15 mg at 08/31/18 2126  . naproxen (NAPROSYN) tablet 500 mg  500 mg Oral BID PRN Sharma Covert, MD   500 mg at 08/31/18 2126  . nicotine (NICODERM CQ - dosed in mg/24 hours) patch 21 mg  21 mg Transdermal Daily Cobos, Myer Peer, MD      . ondansetron (ZOFRAN-ODT) disintegrating tablet 4 mg  4 mg Oral Q6H PRN Rozetta Nunnery, NP      . traZODone (DESYREL) tablet 100 mg  100 mg Oral QHS PRN Sharma Covert, MD        Lab  Results:  Results for orders placed or performed during the hospital encounter of 08/30/18 (from the past 48 hour(s))  TSH     Status: Abnormal   Collection Time:  08/31/18  6:55 AM  Result Value Ref Range   TSH 0.255 (L) 0.350 - 4.500 uIU/mL    Comment: Performed by a 3rd Generation assay with a functional sensitivity of <=0.01 uIU/mL. Performed at [facility], [address] [place], [place] [address]     Blood Alcohol level:  Lab Results  Component Value Date   ETH <10 08/29/2018   ETH <5 02/26/2016    Metabolic Disorder Labs: No results found for: HGBA1C, MPG No results found for: PROLACTIN Lab Results  Component Value Date   CHOL  10/04/2007    152        ATP III CLASSIFICATION:  <200     mg/dL   Desirable  951-884200-239  mg/dL   Borderline High  >=166>=240    mg/dL   High   TRIG 50 06/30/160108/05/2007   HDL 54 10/04/2007   CHOLHDL 2.8 10/04/2007   VLDL 10 10/04/2007   LDLCALC  10/04/2007    88        Total Cholesterol/HDL:CHD Risk Coronary Heart Disease Risk Table                     Men   Women  1/2 Average Risk   3.4   3.3    Physical Findings: AIMS: Facial and Oral Movements Muscles of Facial Expression: None, normal Lips and Perioral Area: None, normal Jaw: None, normal Tongue: None, normal,Extremity Movements Upper (arms, wrists, hands, fingers): None, normal Lower (legs, knees, ankles, toes): None, normal, Trunk Movements Neck, shoulders, hips: None, normal, Overall Severity Severity of abnormal movements (highest score from questions above): None, normal Incapacitation due to abnormal movements: None, normal Patient's awareness of abnormal movements (rate only patient's report): No Awareness, Dental Status Current problems with teeth and/or dentures?: No Does patient usually wear dentures?: No  CIWA:  CIWA-Ar Total: 4 COWS:  COWS Total Score: 1  Musculoskeletal: Strength & Muscle Tone: within normal limits Gait & Station: normal Patient leans:  N/A  Psychiatric Specialty Exam: Physical Exam  Nursing note and vitals reviewed. Constitutional: He is oriented to person, place, and time. He appears well-developed and well-nourished.  HENT:  Head: Normocephalic and atraumatic.  Respiratory: Effort normal.  Neurological: He is alert and oriented to person, place, and time.    ROS  Blood pressure 135/78, pulse 66, temperature 98.6 F (37 C), temperature source Oral, resp. rate 18, height 5\' 8"  (1.727 m), weight 69.9 kg, SpO2 100 %.Body mass index is 23.42 kg/m.  General Appearance: Disheveled  Eye Contact:  Fair  Speech:  Normal Rate  Volume:  Decreased  Mood:  Dysphoric  Affect:  Congruent  Thought Process:  Coherent and Descriptions of Associations: Intact  Orientation:  Full (Time, Place, and Person)  Thought Content:  Logical  Suicidal Thoughts:  No  Homicidal Thoughts:  No  Memory:  Immediate;   Fair Recent;   Fair Remote;   Fair  Judgement:  Intact  Insight:  Fair  Psychomotor Activity:  Normal  Concentration:  Concentration: Fair and Attention Span: Fair  Recall:  F[employer]Fair  Fund of Knowledge:  Fair  Language:  Fair  Akathisia:  Negative  Handed:  Right  AIMS (if indicated):     Assets:  Desire for Improvement Resilience  ADL's:  Intact  Cognition:  WNL  Sleep:  Number of Hours: 6.75     Treatment Plan Summary: Daily contact with patient to assess and evaluate symptoms and progress in treatment, Medication management and  Plan : Patient is a 18 year old male with the above-stated past psychiatric history who is seen in follow-up.   Diagnosis: #1 polysubstance dependence including opiates, benzodiazepines and cannabis.  #2 substance-induced mood disorder  Patient is not feeling very well today.  Most of this is secondary to withdrawal from substances.  His vital signs are stable.  He is afebrile.  His T3 and T4 not back yet.  His TSH was slightly low at 0.255.  His drug screen was positive for benzodiazepines,  opiates and marijuana.  He continues on the mirtazapine 15 mg p.o. nightly, and requested an increase in trazodone.  I will increase that to 100 mg p.o. nightly.  No other changes in his medicines. 1.  Continue Librium 25 mg p.o. 4 times daily PRN a CIWA greater than 10. 2.  Continue clonidine withdrawal protocol for opiate dependence. 3.  Continue Bentyl 20 mg p.o. every 6 hours as needed abdominal cramping. 4.  Continue hydroxyzine 25 mg p.o. 3 times daily as needed anxiety. 5.  Continue Imodium 2 to 4 mg p.o. as needed loose stools 6.  Continue Robaxin 500 mg p.o. every 8 hours as needed muscle spasms. 7.  Continue mirtazapine 15 mg p.o. nightly for depression, anxiety as well as insomnia. 8.  Continue Naprosyn 500 mg p.o. twice daily as needed pain. 9.  Continue Zofran 4 mg p.o. every 6 hours as needed nausea. 10.  Increase trazodone 200 mg p.o. nightly as needed insomnia. 11.  Awaiting T3 and T4 results. 12.  Disposition planning-in progress.  Antonieta PertGreg Lawson Browning [place], MD 09/01/2018, 12:22 PM

## 2018-09-01 NOTE — Tx Team (Signed)
Interdisciplinary Treatment and Diagnostic Plan Update  09/01/2018 Time of Session: 9:00am Arthur Martinez MRN: [id number]  Principal Diagnosis: Severe major depression, single episode, without psychotic features (HCC)  Secondary Diagnoses: Principal Problem:   Severe major depression, single episode, without psychotic features (HCC) Active Problems:   Anxiety state   Attention deficit hyperactivity disorder (ADHD), combined type   MDD (major depressive disorder)   Current Medications:  Current Facility-Administered Medications  Medication Dose Route Frequency Provider Last Rate Last Dose  . acetaminophen (TYLENOL) tablet 650 mg  650 mg Oral Q6H PRN Oneta RackLewis, Tanika N, NP   650 mg at 08/31/18 0926  . alum & mag hydroxide-simeth (MAALOX/MYLANTA) 200-200-20 MG/5ML suspension 30 mL  30 mL Oral Q4H PRN Oneta RackLewis, Tanika N, NP      . chlordiazePOXIDE (LIBRIUM) capsule 25 mg  25 mg Oral QID PRN Antonieta Pertlary, Greg Lawson, MD      . cloNIDine (CATAPRES) tablet 0.1 mg  0.1 mg Oral QID Antonieta Pertlary, Greg Lawson, MD   0.1 mg at 09/01/18 0740   Followed by  . [START ON 09/02/2018] cloNIDine (CATAPRES) tablet 0.1 mg  0.1 mg Oral BH-qamhs Clary, Marlane MingleGreg Lawson, MD       Followed by  . [START ON 09/04/2018] cloNIDine (CATAPRES) tablet 0.1 mg  0.1 mg Oral QAC breakfast Antonieta Pertlary, Greg Lawson, MD      . dicyclomine (BENTYL) tablet 20 mg  20 mg Oral Q6H PRN Antonieta Pertlary, Greg Lawson, MD      . hydrOXYzine (ATARAX/VISTARIL) tablet 25 mg  25 mg Oral TID PRN Jackelyn PolingBerry, Jason A, NP   25 mg at 08/31/18 0926  . loperamide (IMODIUM) capsule 2-4 mg  2-4 mg Oral PRN Nira ConnBerry, Jason A, NP      . magnesium hydroxide (MILK OF MAGNESIA) suspension 30 mL  30 mL Oral Daily PRN Oneta RackLewis, Tanika N, NP      . methocarbamol (ROBAXIN) tablet 500 mg  500 mg Oral Q8H PRN Antonieta Pertlary, Greg Lawson, MD   500 mg at 09/01/18 0744  . mirtazapine (REMERON) tablet 15 mg  15 mg Oral QHS Antonieta Pertlary, Greg Lawson, MD   15 mg at 08/31/18 2126  . naproxen (NAPROSYN) tablet 500 mg  500 mg Oral  BID PRN Antonieta Pertlary, Greg Lawson, MD   500 mg at 08/31/18 2126  . nicotine (NICODERM CQ - dosed in mg/24 hours) patch 21 mg  21 mg Transdermal Daily Cobos, Fernando A, MD      . ondansetron (ZOFRAN-ODT) disintegrating tablet 4 mg  4 mg Oral Q6H PRN Nira ConnBerry, Jason A, NP      . traZODone (DESYREL) tablet 50 mg  50 mg Oral QHS PRN Oneta RackLewis, Tanika N, NP   50 mg at 08/31/18 2224   PTA Medications: No medications prior to admission.    Patient Stressors: Health problems Medication change or noncompliance Substance abuse  Patient Strengths: Ability for insight Barrister's clerkCommunication skills Motivation for treatment/growth Physical Health  Treatment Modalities: Medication Management, Group therapy, Case management,  1 to 1 session with clinician, Psychoeducation, Recreational therapy.   Physician Treatment Plan for Primary Diagnosis: Severe major depression, single episode, without psychotic features (HCC) Long Term Goal(s): Improvement in symptoms so as ready for discharge Improvement in symptoms so as ready for discharge   Short Term Goals: Ability to identify changes in lifestyle to reduce recurrence of condition will improve Ability to verbalize feelings will improve Ability to disclose and discuss suicidal ideas Ability to demonstrate self-control will improve Ability to identify and develop effective coping  behaviors will improve Ability to maintain clinical measurements within normal limits will improve Ability to identify triggers associated with substance abuse/mental health issues will improve Ability to identify changes in lifestyle to reduce recurrence of condition will improve Ability to verbalize feelings will improve Ability to disclose and discuss suicidal ideas Ability to demonstrate self-control will improve Ability to identify and develop effective coping behaviors will improve Ability to maintain clinical measurements within normal limits will improve Ability to identify triggers  associated with substance abuse/mental health issues will improve  Medication Management: Evaluate patient's response, side effects, and tolerance of medication regimen.  Therapeutic Interventions: 1 to 1 sessions, Unit Group sessions and Medication administration.  Evaluation of Outcomes: Progressing  Physician Treatment Plan for Secondary Diagnosis: Principal Problem:   Severe major depression, single episode, without psychotic features (HCC) Active Problems:   Anxiety state   Attention deficit hyperactivity disorder (ADHD), combined type   MDD (major depressive disorder)  Long Term Goal(s): Improvement in symptoms so as ready for discharge Improvement in symptoms so as ready for discharge   Short Term Goals: Ability to identify changes in lifestyle to reduce recurrence of condition will improve Ability to verbalize feelings will improve Ability to disclose and discuss suicidal ideas Ability to demonstrate self-control will improve Ability to identify and develop effective coping behaviors will improve Ability to maintain clinical measurements within normal limits will improve Ability to identify triggers associated with substance abuse/mental health issues will improve Ability to identify changes in lifestyle to reduce recurrence of condition will improve Ability to verbalize feelings will improve Ability to disclose and discuss suicidal ideas Ability to demonstrate self-control will improve Ability to identify and develop effective coping behaviors will improve Ability to maintain clinical measurements within normal limits will improve Ability to identify triggers associated with substance abuse/mental health issues will improve     Medication Management: Evaluate patient's response, side effects, and tolerance of medication regimen.  Therapeutic Interventions: 1 to 1 sessions, Unit Group sessions and Medication administration.  Evaluation of Outcomes: Progressing   RN  Treatment Plan for Primary Diagnosis: Severe major depression, single episode, without psychotic features (HCC) Long Term Goal(s): Knowledge of disease and therapeutic regimen to maintain health will improve  Short Term Goals: Ability to verbalize frustration and anger appropriately will improve, Ability to verbalize feelings will improve, Ability to identify and develop effective coping behaviors will improve and Compliance with prescribed medications will improve  Medication Management: RN will administer medications as ordered by provider, will assess and evaluate patient's response and provide education to patient for prescribed medication. RN will report any adverse and/or side effects to prescribing provider.  Therapeutic Interventions: 1 on 1 counseling sessions, Psychoeducation, Medication administration, Evaluate responses to treatment, Monitor vital signs and CBGs as ordered, Perform/monitor CIWA, COWS, AIMS and Fall Risk screenings as ordered, Perform wound care treatments as ordered.  Evaluation of Outcomes: Progressing   LCSW Treatment Plan for Primary Diagnosis: Severe major depression, single episode, without psychotic features (HCC) Long Term Goal(s): Safe transition to appropriate next level of care at discharge, Engage patient in therapeutic group addressing interpersonal concerns.  Short Term Goals: Engage patient in aftercare planning with referrals and resources, Increase social support, Facilitate acceptance of mental health diagnosis and concerns, Identify triggers associated with mental health/substance abuse issues and Increase skills for wellness and recovery  Therapeutic Interventions: Assess for all discharge needs, 1 to 1 time with Social worker, Explore available resources and support systems, Assess for adequacy in community support  network, Educate family and significant other(s) on suicide prevention, Complete Psychosocial Assessment, Interpersonal group  therapy.  Evaluation of Outcomes: Progressing   Progress in Treatment: Attending groups: Yes. Participating in groups: Yes. Taking medication as prescribed: Yes. Toleration medication: Yes. Family/Significant other contact made: No, will contact:  patient declines consents, SPE reviewed with patient. Patient understands diagnosis: Yes. Discussing patient identified problems/goals with staff: Yes. Medical problems stabilized or resolved: No. Denies suicidal/homicidal ideation: Yes. Issues/concerns per patient self-inventory: Yes.  New problem(s) identified: Yes, Describe:  grief, legal issues  New Short Term/Long Term Goal(s): detox, medication management for mood stabilization; elimination of SI thoughts; development of comprehensive mental wellness/sobriety plan.  Patient Goals:    Discharge Plan or Barriers: Declines residential substance use treatment referrals, and upcoming court dates are a barrier. Plans to return to his dad's house and follow up with [facility] for outpatient. CSW provided patient information for grief counseling through Hospice.  Reason for Continuation of Hospitalization: Aggression Anxiety Depression Medication stabilization Withdrawal symptoms  Estimated Length of Stay: 1-2 days  Attendees: Patient: 09/01/2018 9:53 AM  Physician:  09/01/2018 9:53 AM  Nursing:  09/01/2018 9:53 AM  RN Care Manager: 09/01/2018 9:53 AM  Social Worker: Stephanie Acre, [place] 09/01/2018 9:53 AM  Recreational Therapist:  09/01/2018 9:53 AM  Other:  09/01/2018 9:53 AM  Other:  09/01/2018 9:53 AM  Other: 09/01/2018 9:53 AM    Scribe for Treatment Team: Joellen Jersey, [place] 09/01/2018 9:53 AM

## 2018-09-01 NOTE — Progress Notes (Addendum)
Patient reports that his day was ok, but he was experiencing withdrawal symptoms of sweating and diarrhea.  He denies any thoughts of hurting himself or others and contracts for safety on the unit.  He reported issues going to sleep even with trazodone.  Medications administered as ordered.Emotional support provided.  Safety checks q 15 minutes.  Safety maintained on unit.   [place] NOVEL CORONAVIRUS (COVID-19) DAILY CHECK-OFF SYMPTOMS - answer yes or no to each - every day NO YES  Have you had a fever in the past 24 hours?  . Fever (Temp > 37.80C / 100F) X   Have you had any of these symptoms in the past 24 hours? . New Cough .  Sore Throat  .  Shortness of Breath .  Difficulty Breathing .  Unexplained Body Aches   X   Have you had any one of these symptoms in the past 24 hours not related to allergies?   . Runny Nose .  Nasal Congestion .  Sneezing   X   If you have had runny nose, nasal congestion, sneezing in the past 24 hours, has it worsened?  X   EXPOSURES - check yes or no X   Have you traveled outside the state in the past 14 days?  X   Have you been in contact with someone with a confirmed diagnosis of COVID-19 or PUI in the past 14 days without wearing appropriate PPE?  X   Have you been living in the same home as a person with confirmed diagnosis of COVID-19 or a PUI (household contact)?    X   Have you been diagnosed with COVID-19?    X              What to do next: Answered NO to all: Answered YES to anything:   Proceed with unit schedule Follow the [facility] Inpatient Flowsheet.

## 2018-09-01 NOTE — Progress Notes (Signed)
[place] NOVEL CORONAVIRUS (COVID-19) DAILY CHECK-OFF SYMPTOMS - answer yes or no to each - every day NO YES  Have you had a fever in the past 24 hours?  . Fever (Temp > 37.80C / 100F) X   Have you had any of these symptoms in the past 24 hours? . New Cough .  Sore Throat  .  Shortness of Breath .  Difficulty Breathing .  Unexplained Body Aches   X   Have you had any one of these symptoms in the past 24 hours not related to allergies?   . Runny Nose .  Nasal Congestion .  Sneezing   X   If you have had runny nose, nasal congestion, sneezing in the past 24 hours, has it worsened?  X   EXPOSURES - check yes or no X   Have you traveled outside the state in the past 14 days?  X   Have you been in contact with someone with a confirmed diagnosis of COVID-19 or PUI in the past 14 days without wearing appropriate PPE?  X   Have you been living in the same home as a person with confirmed diagnosis of COVID-19 or a PUI (household contact)?    X   Have you been diagnosed with COVID-19?    X              What to do next: Answered NO to all: Answered YES to anything:   Proceed with unit schedule Follow the [facility] Inpatient Flowsheet.   

## 2018-09-01 NOTE — Progress Notes (Signed)
Patient ID: Arthur Martinez, male   DOB: 2000/04/17, 18 y.o.   MRN: [id number] Pt has been flat, depressed, sullen at times. Pt has been appropriate and cooperative on approach. Positive for groups and activities with minimal prompting. Pt expressed some anxiety related to w/d s/s. Pt contracts for safety. No distress noted. Support and encouragement provided. Med ed initiated and reinforced. Pt verbalizes understanding. Safety maintained.

## 2018-09-01 NOTE — Progress Notes (Signed)
Patient states that he had an okay day but also admits that he felt down today as compared to yesterday. Less energetic and less sociable. His goal for tomorrow is to feel better than he did today.

## 2018-09-02 LAB — T3, FREE: T3, Free: 2.1 pg/mL (ref 2.0–4.4)

## 2018-09-02 LAB — T4: T4, Total: 6.2 ug/dL (ref 4.5–12.0)

## 2018-09-02 MED ORDER — NICOTINE 21 MG/24HR TD PT24: 21.0000 mg | MEDICATED_PATCH | Freq: Every day | TRANSDERMAL | 0 refills | Status: DC

## 2018-09-02 MED ORDER — MIRTAZAPINE 15 MG PO TABS: 15.0000 mg | ORAL_TABLET | Freq: Every day | ORAL | 0 refills | Status: DC

## 2018-09-02 MED ORDER — LORAZEPAM 1 MG PO TABS
ORAL_TABLET | ORAL | Status: AC
  Filled 2018-09-02: qty 1

## 2018-09-02 MED ORDER — LORAZEPAM 1 MG PO TABS
1.0000 mg | ORAL_TABLET | Freq: Once | ORAL | Status: AC
  Administered 2018-09-02: 1 mg via ORAL

## 2018-09-02 NOTE — [facility] Suicide Risk Assessment (Signed)
[facility] Discharge Suicide Risk Assessment   Principal Problem: Severe major depression, single episode, without psychotic features [facility]) Discharge Diagnoses: Principal Problem:   Severe major depression, single episode, without psychotic features ([place]) Active Problems:   Anxiety state   Attention deficit hyperactivity disorder (ADHD), combined type   MDD (major depressive disorder)   Total Time spent with patient: 20 minutes  Musculoskeletal: Strength & Muscle Tone: within normal limits Gait & Station: normal Patient leans: N/A  Psychiatric Specialty Exam: Review of Systems  All other systems reviewed and are negative.   Blood pressure 124/87, pulse (!) 55, temperature 97.6 F (36.4 C), temperature source Oral, resp. rate 18, height 5\' 8"  (1.727 m), weight 69.9 kg, SpO2 100 %.Body mass index is 23.42 kg/m.  General Appearance: Casual  Eye Contact::  Fair  Speech:  Normal Rate409  Volume:  Normal  Mood:  Euthymic  Affect:  Constricted  Thought Process:  Coherent and Descriptions of Associations: Intact  Orientation:  Full (Time, Place, and Person)  Thought Content:  Logical  Suicidal Thoughts:  No  Homicidal Thoughts:  No  Memory:  Immediate;   Fair Recent;   Fair Remote;   Fair  Judgement:  Intact  Insight:  Fair  Psychomotor Activity:  Decreased  Concentration:  Fair  Recall:  [employer] of Knowledge:Fair  Language: Fair  Akathisia:  Negative  Handed:  Right  AIMS (if indicated):     Assets:  Desire for Improvement Resilience  Sleep:  Number of Hours: 0  Cognition: WNL  ADL's:  Intact   Mental Status Per Nursing Assessment::   On Admission:  Suicidal ideation indicated by patient, Self-harm thoughts  Demographic Factors:  Male, Adolescent or young adult, Low socioeconomic status and Unemployed  Loss Factors: NA  Historical Factors: Impulsivity  Risk Reduction Factors:   Sense of responsibility to family and Positive social support  Continued  Clinical Symptoms:  Alcohol/Substance Abuse/Dependencies  Cognitive Features That Contribute To Risk:  None    Suicide Risk:  Minimal: No identifiable suicidal ideation.  Patients presenting with no risk factors but with morbid ruminations; may be classified as minimal risk based on the severity of the depressive symptoms  Follow-up Information    [facility] Follow up on 09/08/2018.   Why: Telephonic hospital follow up appointment is Wednesday, 7/8 at 10:00a.  The provider will contact you for the appointment. Contact information: [address] [place] [address] [phone]           Plan Of Care/Follow-up recommendations:  Activity:  ad lib  Sharma Covert, MD 09/02/2018, 7:54 AM

## 2018-09-02 NOTE — Progress Notes (Signed)
Discharge Note:  Patient discharged with friend.  Patient denied SI and HI.  Denied A/V hallucinations.  Suicide prevention information given and discussed with patient who stated he understood and had no questions.  Patient stated he received all his belongings, clothing, toiletries, misc items.  Patient stated he appreciated all assistance received from [facility] staff.  All required discharge information given to patient at discharge.   

## 2018-09-02 NOTE — Progress Notes (Signed)
D: Between 12:30 and 12:45 am, this author went to the laundry room to retrieve a blanket from the dryer. Upon returning to the patient's room, the patient could not be found initially. He was found in room 306 talking to a male peer. A: When asked what he was doing in another peer's room, he said that he was looking for another "cover" (bed cover). R; He returned to his room without any further issues.

## 2018-09-02 NOTE — Discharge Summary (Signed)
Physician Discharge Summary Note  Patient:  Arthur Martinez is an 18 y.o., male MRN:  [id number] DOB:  04-18-2000 Patient phone:  [phone] (home)  Patient address:   [address]  [place] [address],  Total Time spent with patient: 15 minutes  Date of Admission:  08/30/2018 Date of Discharge: 09/02/18  Reason for Admission:  Depression with polysubstance abuse  Principal Problem: Severe major depression, single episode, without psychotic features [facility]) Discharge Diagnoses: Principal Problem:   Severe major depression, single episode, without psychotic features [facility]) Active Problems:   Anxiety state   Attention deficit hyperactivity disorder (ADHD), combined type   MDD (major depressive disorder)   Past Psychiatric History: Per admission H&P: Patient was last admitted to the psychiatric hospital in April of this year.  He had been placed under involuntary commitment under similar circumstances.  He had also been seen by psychiatry in the emergency department at [facility] on 07/22/2014 for suicidal ideation and irritability.  He has been previously diagnosed with bipolar disorder and has been previously prescribed Risperdal as well as Neurontin.  He has a history of polysubstance dependence as well.  Past Medical History:  Past Medical History:  Diagnosis Date  . Hypertension     Past Surgical History:  Procedure Laterality Date  . CIRCUMCISION     Family History:  Family History  Problem Relation Age of Onset  . Heart attack Maternal Grandfather   . Bipolar disorder Maternal Grandfather   . Schizophrenia Maternal Grandfather   . Stroke Paternal Grandfather   . Depression Mother   . Anxiety disorder Mother   . ADD / ADHD Brother   . Depression Maternal Grandmother   . Anxiety disorder Maternal Grandmother   . Migraines Other    Family Psychiatric  History: Bipolar disorder in a grandfather, schizophrenia and a grandfather, depression in  mother, anxiety in mother, attention problems in brother Social History:  Social History   Substance and Sexual Activity  Alcohol Use Not Currently     Social History   Substance and Sexual Activity  Drug Use Yes  . Frequency: 7.0 times per week  . Types: Marijuana, Heroin    Social History   Socioeconomic History  . Marital status: Single    Spouse name: Not on file  . Number of children: Not on file  . Years of education: Not on file  . Highest education level: Not on file  Occupational History  . Not on file  Social Needs  . Financial resource strain: Not on file  . Food insecurity    Worry: Not on file    Inability: Not on file  . Transportation needs    Medical: Not on file    Non-medical: Not on file  Tobacco Use  . Smoking status: Current Every Day Smoker    Packs/day: 1.00    Years: 7.00    Pack years: 7.00    Types: Cigarettes  . Smokeless tobacco: Never Used  . Tobacco comment: Tried smoking and dipping once  Substance and Sexual Activity  . Alcohol use: Not Currently  . Drug use: Yes    Frequency: 7.0 times per week    Types: Marijuana, Heroin  . Sexual activity: Yes    Birth control/protection: Condom  Lifestyle  . Physical activity    Days per week: Not on file    Minutes per session: Not on file  . Stress: Not on file  Relationships  . Social connections  Talks on phone: Not on file    Gets together: Not on file    Attends religious service: Not on file    Active member of club or organization: Not on file    Attends meetings of clubs or organizations: Not on file    Relationship status: Not on file  Other Topics Concern  . Not on file  Social History Narrative  . Not on file    Hospital Course:  From admission H&P: Patient is a 18 year old male who was brought [facility] under involuntary commitment. The involuntary commitment paperwork stated the patient was brought in secondary to  the fact that the patient was holding a knife and was threatening to kill himself or others. He admitted to have a history of taking benzodiazepines, opiates including heroin, and as well marijuana. He had been psychiatrically hospitalized before. He was seen by Dr. Sharma CovertNorman in April of this year. He [place] but only remained there 1 week. The patient stated that he did have a drug problem, and had been using drugs [place] to [place], but his brother was shot and killed. This occurred in January/February of this year. He stated that after [facility] he remained sober for 2 to 3 weeks, but then contacted police to see how the investigation was going on the death of his brother, and that they had made no progress, and that had caused him to relapse. He did admit to depressive symptoms as well as not sleeping. The patient denied using alcohol on a regular basis, but the tele-assessment note did state that he had been using alcohol. He denied previous symptoms of benzodiazepine withdrawal or opiate withdrawal. He was admitted to the hospital for evaluation and stabilization.  Mr. Earlene PlaterDavis was admitted for depression with polysubstance abuse. IVC paperwork from his father indicated that he had threatened to hurt himself and others while intoxicated. UDS was positive for BZDs, opioids, THC. He was started on clonidine COWS protocol and Librium PRN CIWA>10. Remeron was started for depression and insomnia. He responded well to treatment with no adverse effects reported. [facility]. He is discharging on the medications listed below. He has shown improvement with improved mood, affect, and interaction. On day of discharge, he is calm and cooperative, reporting stable mood. He denies any SI/HI/AVH and contracts for safety. [facility] (see  below). He is provided with prescriptions for medications upon discharge. His father is picking him up for discharge home.  Physical Findings: AIMS: Facial and Oral Movements Muscles of Facial Expression: None, normal Lips and Perioral Area: None, normal Jaw: None, normal Tongue: None, normal,Extremity Movements Upper (arms, wrists, hands, fingers): None, normal Lower (legs, knees, ankles, toes): None, normal, Trunk Movements Neck, shoulders, hips: None, normal, Overall Severity Severity of abnormal movements (highest score from questions above): None, normal Incapacitation due to abnormal movements: None, normal Patient's awareness of abnormal movements (rate only patient's report): No Awareness, Dental Status Current problems with teeth and/or dentures?: No Does patient usually wear dentures?: No  CIWA:  CIWA-Ar Total: 1 COWS:  COWS Total Score: 1  Musculoskeletal: Strength & Muscle Tone: within normal limits Gait & Station: normal Patient leans: N/A  Psychiatric Specialty Exam: Physical Exam  Nursing note and vitals reviewed. Constitutional: He is oriented to person,  place, and time. He appears well-developed and well-nourished.  Cardiovascular: Normal rate.  Respiratory: Effort normal.  Neurological: He is alert and oriented to person, place, and time.    Review of Systems  Constitutional: Negative.   Respiratory: Negative for cough and shortness of breath.   Cardiovascular: Negative for chest pain.  Gastrointestinal: Negative for diarrhea, nausea and vomiting.  Psychiatric/Behavioral: Positive for depression (stable on medication) and substance abuse. Negative for hallucinations and suicidal ideas. The patient is not nervous/anxious and does not have insomnia.     Blood pressure 124/87, pulse (!) 55, temperature 97.6 F (36.4 C), temperature source Oral, resp. rate 18, height 5\' 8"  (1.727 m), weight 69.9 kg, SpO2 100 %.Body mass index is 23.42 kg/m.  See MD's discharge  SRA       Has this patient used any form of tobacco in the last 30 days? (Cigarettes, Smokeless Tobacco, Cigars, and/or Pipes) Yes, a prescription for an FDA-approved medication for tobacco cessation was offered at discharge.  Blood Alcohol level:  Lab Results  Component Value Date   ETH <10 08/29/2018   ETH <5 02/26/2016    Metabolic Disorder Labs:  No results found for: HGBA1C, MPG No results found for: PROLACTIN Lab Results  Component Value Date   CHOL  10/04/2007    152        ATP III CLASSIFICATION:  <200     mg/dL   Desirable  [id number]  mg/dL   Borderline High  >=045>=240    mg/dL   High   TRIG 50 40/98/119108/05/2007   HDL 54 10/04/2007   CHOLHDL 2.8 10/04/2007   VLDL 10 10/04/2007   LDLCALC  10/04/2007    88        Total Cholesterol/HDL:CHD Risk Coronary Heart Disease Risk Table                     Men   Women  1/2 Average Risk   3.4   3.3    See Psychiatric Specialty Exam and Suicide Risk Assessment completed by Attending Physician prior to discharge.  Discharge destination:  Home  Is patient on multiple antipsychotic therapies at discharge:  No   Has Patient had three or more failed trials of antipsychotic monotherapy by history:  No  Recommended Plan for Multiple Antipsychotic Therapies: NA  Discharge Instructions    Discharge instructions   Complete by: As directed    Patient is instructed to take all prescribed medications as recommended. Report any side effects or adverse reactions to your outpatient psychiatrist. Patient is instructed to abstain from alcohol and illegal drugs while on prescription medications. In the event of worsening symptoms, patient is instructed to call the crisis hotline, 911, or go to the nearest emergency department for evaluation and treatment.     Allergies as of 09/02/2018   No Known Allergies     Medication List    TAKE these medications     Indication  mirtazapine 15 MG tablet Commonly known as: REMERON Take 1 tablet (15 mg  total) by mouth at bedtime.  Indication: Major Depressive Disorder   nicotine 21 mg/24hr patch Commonly known as: NICODERM CQ - dosed in mg/24 hours Place 1 patch (21 mg total) onto the skin daily. Start taking on: September 03, 2018  Indication: Nicotine Addiction      Follow-up Information    [facility] Follow up on 09/08/2018.   Why: Telephonic hospital follow up appointment is Wednesday, 7/8 at 10:00a.  The provider will  contact you for the appointment. Contact information: [address] [place] [id number] [phone]        [facility] Follow up on 09/06/2018.   Why: Please attend your intake assessment for suboxone with Ms. Walker on Monday, 7/6 at 11:00a.  Be sure to bring your photo ID, insurance card and current medications.  Contact information: [address] [place] [address] [phone]           Follow-up recommendations: Activity as tolerated. Diet as recommended by primary care physician. Keep all scheduled follow-up appointments as recommended.   Comments:   Patient is instructed to take all prescribed medications as recommended. Report any side effects or adverse reactions to your outpatient psychiatrist. Patient is instructed to abstain from alcohol and illegal drugs while on prescription medications. In the event of worsening symptoms, patient is instructed to call the crisis hotline, 911, or go to the nearest emergency department for evaluation and treatment.  Signed: Aldean BakerJanet E Samiyah Stupka, NP 09/02/2018, 10:08 AM

## 2018-09-02 NOTE — Progress Notes (Signed)
  [facility] Adult Case Management Discharge Plan :  Will you be returning to the same living situation after discharge:  Yes,  home with father At discharge, do you have transportation home?: Yes,  father will pick up Do you have the ability to pay for your medications: Yes,  Medicaid.  Release of information consent forms completed and in the chart. Letter and grief counseling resources placed on chart. Patient to Follow up at: Follow-up Information    [facility] Follow up on 09/08/2018.   Why: Telephonic hospital follow up appointment is Wednesday, 7/8 at 10:00a.  The provider will contact you for the appointment. Contact information: [address] [place] [address] [phone]        [facility] Follow up on 09/06/2018.   Why: Please attend your intake assessment for suboxone with Ms. Walker on Monday, 7/6 at 11:00a.  Be sure to bring your photo ID, insurance card and current medications.  Contact information: [place] [address] [phone]           Next level of care provider has access to [place] and Suicide Prevention discussed: Yes,  with patient. Declines collateral consents.    Has patient been referred to the Quitline?: Patient refused referral  Patient has been referred for addiction treatment: Yes  Joellen Jersey, [place] 09/02/2018, 9:20 AM

## 2018-09-02 NOTE — Progress Notes (Signed)
D:  Patient denied SI and HI, contracts for safety.  Denied A/V hallucinations.  Denied pain. A:  Medications administered per MD orders.  Emotional support and encouragement given patient. R:  Safety maintained with 15 minute checks.  

## 2018-09-02 NOTE — Plan of Care (Signed)
Nurse discussed anxiety, depression and coping skills with patient.  

## 2018-10-03 ENCOUNTER — Encounter (HOSPITAL_COMMUNITY): Payer: Self-pay | Admitting: Emergency Medicine

## 2018-10-03 ENCOUNTER — Emergency Department (HOSPITAL_COMMUNITY)
Admission: EM | Admit: 2018-10-03 | Discharge: 2018-10-04 | Disposition: A | Discharge status: Home / Self Care | Payer: Medicaid Other | Attending: Emergency Medicine | Admitting: Emergency Medicine

## 2018-10-03 ENCOUNTER — Other Ambulatory Visit: Payer: Self-pay

## 2018-10-03 DIAGNOSIS — Z046 Encounter for general psychiatric examination, requested by authority: Secondary | ICD-10-CM

## 2018-10-03 DIAGNOSIS — F17210 Nicotine dependence, cigarettes, uncomplicated: Secondary | ICD-10-CM | POA: Diagnosis not present

## 2018-10-03 DIAGNOSIS — F1210 Cannabis abuse, uncomplicated: Secondary | ICD-10-CM | POA: Insufficient documentation

## 2018-10-03 DIAGNOSIS — F05 Delirium due to known physiological condition: Secondary | ICD-10-CM | POA: Diagnosis not present

## 2018-10-03 DIAGNOSIS — F1910 Other psychoactive substance abuse, uncomplicated: Secondary | ICD-10-CM | POA: Diagnosis present

## 2018-10-03 DIAGNOSIS — F329 Major depressive disorder, single episode, unspecified: Secondary | ICD-10-CM | POA: Diagnosis not present

## 2018-10-03 DIAGNOSIS — I10 Essential (primary) hypertension: Secondary | ICD-10-CM | POA: Insufficient documentation

## 2018-10-03 DIAGNOSIS — F13121 Sedative, hypnotic or anxiolytic abuse with intoxication delirium: Secondary | ICD-10-CM | POA: Insufficient documentation

## 2018-10-03 DIAGNOSIS — F19188 Other psychoactive substance abuse with other psychoactive substance-induced disorder: Secondary | ICD-10-CM | POA: Diagnosis not present

## 2018-10-03 DIAGNOSIS — F19921 Other psychoactive substance use, unspecified with intoxication with delirium: Secondary | ICD-10-CM

## 2018-10-03 DIAGNOSIS — T50902A Poisoning by unspecified drugs, medicaments and biological substances, intentional self-harm, initial encounter: Secondary | ICD-10-CM | POA: Diagnosis present

## 2018-10-03 LAB — CBC WITH DIFFERENTIAL/PLATELET
Abs Immature Granulocytes: 0.02 10*3/uL (ref 0.00–0.07)
Basophils Absolute: 0 10*3/uL (ref 0.0–0.1)
Basophils Relative: 1 %
Eosinophils Absolute: 0.4 10*3/uL (ref 0.0–0.5)
Eosinophils Relative: 4 %
HCT: 37.7 % — ABNORMAL LOW (ref 39.0–52.0)
Hemoglobin: 12.4 g/dL — ABNORMAL LOW (ref 13.0–17.0)
Immature Granulocytes: 0 %
Lymphocytes Relative: 29 %
Lymphs Abs: 2.6 10*3/uL (ref 0.7–4.0)
MCH: 31.5 pg (ref 26.0–34.0)
MCHC: 32.9 g/dL (ref 30.0–36.0)
MCV: 95.7 fL (ref 80.0–100.0)
Monocytes Absolute: 0.6 10*3/uL (ref 0.1–1.0)
Monocytes Relative: 7 %
Neutro Abs: 5.2 10*3/uL (ref 1.7–7.7)
Neutrophils Relative %: 59 %
Platelets: 284 10*3/uL (ref 150–400)
RBC: 3.94 MIL/uL — ABNORMAL LOW (ref 4.22–5.81)
RDW: 12.2 % (ref 11.5–15.5)
WBC: 8.8 10*3/uL (ref 4.0–10.5)
nRBC: 0 % (ref 0.0–0.2)

## 2018-10-03 LAB — COMPREHENSIVE METABOLIC PANEL
ALT: 25 U/L (ref 0–44)
AST: 20 U/L (ref 15–41)
Albumin: 3.6 g/dL (ref 3.5–5.0)
Alkaline Phosphatase: 85 U/L (ref 38–126)
Anion gap: 10 (ref 5–15)
BUN: 10 mg/dL (ref 6–20)
CO2: 25 mmol/L (ref 22–32)
Calcium: 9.1 mg/dL (ref 8.9–10.3)
Chloride: 105 mmol/L (ref 98–111)
Creatinine, Ser: 0.88 mg/dL (ref 0.61–1.24)
GFR calc Af Amer: >60 mL/min (ref >60)
GFR calc non Af Amer: >60 mL/min (ref >60)
Glucose, Bld: 80 mg/dL (ref 70–99)
Potassium: 4 mmol/L (ref 3.5–5.1)
Sodium: 140 mmol/L (ref 135–145)
Total Bilirubin: 0.5 mg/dL (ref 0.3–1.2)
Total Protein: 7.3 g/dL (ref 6.5–8.1)

## 2018-10-03 LAB — RAPID URINE DRUG SCREEN, HOSP PERFORMED
Amphetamines: NOT DETECTED
Barbiturates: NOT DETECTED
Benzodiazepines: POSITIVE — AB
Cocaine: NOT DETECTED
Opiates: POSITIVE — AB
Tetrahydrocannabinol: POSITIVE — AB

## 2018-10-03 LAB — OSMOLALITY: Osmolality: 290 mOsm/kg (ref 275–295)

## 2018-10-03 LAB — ETHANOL: Alcohol, Ethyl (B): <10 mg/dL (ref <10)

## 2018-10-03 LAB — ACETAMINOPHEN LEVEL: Acetaminophen (Tylenol), Serum: <10 ug/mL — ABNORMAL LOW (ref 10–30)

## 2018-10-03 LAB — SALICYLATE LEVEL: Salicylate Lvl: <7 mg/dL (ref 2.8–30.0)

## 2018-10-03 MED ORDER — ZIPRASIDONE MESYLATE 20 MG IM SOLR
10.0000 mg | Freq: Once | INTRAMUSCULAR | Status: AC
  Administered 2018-10-03: 10 mg via INTRAMUSCULAR
  Filled 2018-10-03: qty 20

## 2018-10-03 MED ORDER — STERILE WATER FOR INJECTION IJ SOLN
INTRAMUSCULAR | Status: AC
  Administered 2018-10-03: 20:00:00 1.2 mL
  Filled 2018-10-03: qty 10

## 2018-10-03 MED ORDER — ZIPRASIDONE MESYLATE 20 MG IM SOLR
10.0000 mg | Freq: Once | INTRAMUSCULAR | Status: AC | PRN
  Administered 2018-10-03: 10 mg via INTRAMUSCULAR
  Filled 2018-10-03: qty 20

## 2018-10-03 MED ORDER — STERILE WATER FOR INJECTION IJ SOLN
INTRAMUSCULAR | Status: AC
  Administered 2018-10-03: 08:00:00 1.2 mL
  Filled 2018-10-03: qty 10

## 2018-10-03 NOTE — ED Notes (Addendum)
Pt came to hallway requesting something to eat, this nurse offered something to eat. This nurse heated up pt a TV dinner per request

## 2018-10-03 NOTE — Consult Note (Addendum)
[facility] Face-to-Face Psychiatry Consult   Reason for Consult:  Drug Overdose Referring Physician:  Antony MaduraKelly Humes, PA-C Patient Identification: Arthur Martinez Principal Diagnosis: Major depressive disorder Diagnosis:  Severe major depressive, single episode, without psychotic features (HCC)   Total Time spent with patient: 30 minutes  Subjective: " I was not trying to kill myself."  Arthur NickelJalen Martinez Martinez is a 18 y.o. male patient [facility]. During the patient assessment he remains sedated, drowsy, but able to arouse for short periods. Patient is dysphoric, increase agitation and restless when speaking to him.  Per LCSW note: Upon this clinician's exam patient is calm and cooperative. When asked why he is in the hospital he states "I'm having some emotional problems since my little brother died. He was shot in January. He was 17." Patient admits to using Xanax the previous day"trying to get rid of the pain."  The patient was seen face-to-face by this provider; chart reviewed and consulted with Dr. Jama Flavorsobos on 10/03/2018 due to the care of the patient. It was discussed with the provider that the patient does meet criteria to be admitted to the psychiatric inpatient unit. On evaluation the patient is alert and oriented x 1-2, he is sedated,but attempting to answer questions. The patient continues to voiced "I was not trying to hurt myself." The patient reports, that his younger brother died a month ago. He continues to deny this was a intentional overdose. The patient is positive for opiates, benzodiazepines, and THCs. He is presenting with some delusional thinking. . The patient denies suicidal, homicidal, or self-harm ideations.  Plan: The patient is a safety risk to self and does require psychiatric inpatient admission for stabilization and treatment.  HPI:  Per Ms. Jobe GibbonHumes, PA-c; [name] is a 18 y.o. male.  18 year old male with a history of depression,  tension headaches, insomnia [facility].  There is concern from family about possible drug overdose.  He was found in a chair seemingly unresponsive.  Does have a history of drug abuse with reports of snorting heroin and Xanax.  Family states that the patient has been making suicidal threats.  He denies suicidal ideations during my encounter with him.  Also denies any drug ingestion tonight.  States that he was "going to smoke marijuana, but fell asleep".  Per RN, father en route to take out IVC paperwork.  The history is provided by the patient. No language interpreter was used.  Drug Overdose  Past Psychiatric History:   Risk to Self:  Yes Risk to Others:  No Prior Inpatient Therapy:  Yes Prior Outpatient Therapy:  Yes  Past Medical History:  Past Medical History:  Diagnosis Date  . Hypertension     Past Surgical History:  Procedure Laterality Date  . CIRCUMCISION     Family History:  Family History  Problem Relation Age of Onset  . Heart attack Maternal Grandfather   . Bipolar disorder Maternal Grandfather   . Schizophrenia Maternal Grandfather   . Stroke Paternal Grandfather   . Depression Mother   . Anxiety disorder Mother   . ADD / ADHD Brother   . Depression Maternal Grandmother   . Anxiety disorder Maternal Grandmother   . Migraines Other    Family Psychiatric  History:  Social History:  Social History   Substance and Sexual Activity  Alcohol Use Not Currently     Social History   Substance and Sexual Activity  Drug  Use Yes  . Frequency: 7.0 times per week  . Types: Marijuana, Heroin    Social History   Socioeconomic History  . Marital status: Single    Spouse name: Not on file  . Number of children: Not on file  . Years of education: Not on file  . Highest education level: Not on file  Occupational History  . Not on file  Social Needs  . Financial resource strain: Not on file  . Food insecurity    Worry: Not on file     Inability: Not on file  . Transportation needs    Medical: Not on file    Non-medical: Not on file  Tobacco Use  . Smoking status: Current Every Day Smoker    Packs/day: 1.00    Years: 7.00    Pack years: 7.00    Types: Cigarettes  . Smokeless tobacco: Never Used  . Tobacco comment: Tried smoking and dipping once  Substance and Sexual Activity  . Alcohol use: Not Currently  . Drug use: Yes    Frequency: 7.0 times per week    Types: Marijuana, Heroin  . Sexual activity: Yes    Birth control/protection: Condom  Lifestyle  . Physical activity    Days per week: Not on file    Minutes per session: Not on file  . Stress: Not on file  Relationships  . Social Herbalist on phone: Not on file    Gets together: Not on file    Attends religious service: Not on file    Active member of club or organization: Not on file    Attends meetings of clubs or organizations: Not on file    Relationship status: Not on file  Other Topics Concern  . Not on file  Social History Narrative  . Not on file   Additional Social History:    Allergies:  No Known Allergies  Labs:  Results for orders placed or performed during the hospital encounter of 10/03/18 (from the past 48 hour(s))  CBC with Differential     Status: Abnormal   Collection Time: 10/03/18  3:57 AM  Result Value Ref Range   WBC 8.8 4.0 - 10.5 K/uL   RBC 3.94 (L) 4.22 - 5.81 MIL/uL   Hemoglobin 12.4 (L) 13.0 - 17.0 g/dL   HCT 37.7 (L) 39.0 - 52.0 %   MCV 95.7 80.0 - 100.0 fL   MCH 31.5 26.0 - 34.0 pg   MCHC 32.9 30.0 - 36.0 g/dL   RDW 12.2 11.5 - 15.5 %   Platelets 284 150 - 400 K/uL   nRBC 0.0 0.0 - 0.2 %   Neutrophils Relative % 59 %   Neutro Abs 5.2 1.7 - 7.7 K/uL   Lymphocytes Relative 29 %   Lymphs Abs 2.6 0.7 - 4.0 K/uL   Monocytes Relative 7 %   Monocytes Absolute 0.6 0.1 - 1.0 K/uL   Eosinophils Relative 4 %   Eosinophils Absolute 0.4 0.0 - 0.5 K/uL   Basophils Relative 1 %   Basophils Absolute 0.0  0.0 - 0.1 K/uL   Immature Granulocytes 0 %   Abs Immature Granulocytes 0.02 0.00 - 0.07 K/uL    Comment: Performed at [facility], [place] [address] [place], [place] [address]  Comprehensive metabolic panel     Status: None   Collection Time: 10/03/18  3:57 AM  Result Value Ref Range   Sodium 140 135 - 145 mmol/L   Potassium 4.0  3.5 - 5.1 mmol/L   Chloride 105 98 - 111 mmol/L   CO2 25 22 - 32 mmol/L   Glucose, Bld 80 70 - 99 mg/dL   BUN 10 6 - 20 mg/dL   Creatinine, Ser 3.080.88 0.61 - 1.24 mg/dL   Calcium 9.1 8.9 - 65.710.3 mg/dL   Total Protein 7.3 6.5 - 8.1 g/dL   Albumin 3.6 3.5 - 5.0 g/dL   AST 20 15 - 41 U/L   ALT 25 0 - 44 U/L   Alkaline Phosphatase 85 38 - 126 U/L   Total Bilirubin 0.5 0.3 - 1.2 mg/dL   GFR calc non Af Amer >60 >60 mL/min   GFR calc Af Amer >60 >60 mL/min   Anion gap 10 5 - 15    Comment: Performed at [facility], [address] [place], [place] [address]  Ethanol     Status: None   Collection Time: 10/03/18  3:57 AM  Result Value Ref Range   Alcohol, Ethyl (B) <10 <10 mg/dL    Comment: (NOTE) Lowest detectable limit for serum alcohol is 10 mg/dL. For medical purposes only. Performed at [facility], [address] [place], [place] [address]   Acetaminophen level     Status: Abnormal   Collection Time: 10/03/18  3:57 AM  Result Value Ref Range   Acetaminophen (Tylenol), Serum <10 (L) 10 - 30 ug/mL    Comment: (NOTE) Therapeutic concentrations vary significantly. A range of 10-30 ug/mL  may be an effective concentration for many patients. However, some  are best treated at concentrations outside of this range. Acetaminophen concentrations >150 ug/mL at 4 hours after ingestion  and >50 ug/mL at 12 hours after ingestion are often associated with  toxic reactions. Performed at [facility], [address] [place], [place] [address]   Salicylate level     Status: None    Collection Time: 10/03/18  3:57 AM  Result Value Ref Range   Salicylate Lvl <7.0 2.8 - 30.0 mg/dL    Comment: Performed at [facility], [address] [place], [place] [address]  Osmolality     Status: None   Collection Time: 10/03/18  3:57 AM  Result Value Ref Range   Osmolality 290 275 - 295 mOsm/kg    Comment: Performed at [facility], [address] [place], [place] [address]  Rapid urine drug screen (hospital performed)     Status: Abnormal   Collection Time: 10/03/18  6:30 AM  Result Value Ref Range   Opiates POSITIVE (A) NONE DETECTED   Cocaine NONE DETECTED NONE DETECTED   Benzodiazepines POSITIVE (A) NONE DETECTED   Amphetamines NONE DETECTED NONE DETECTED   Tetrahydrocannabinol POSITIVE (A) NONE DETECTED   Barbiturates NONE DETECTED NONE DETECTED    Comment: (NOTE) DRUG SCREEN FOR MEDICAL PURPOSES ONLY.  IF CONFIRMATION IS NEEDED FOR ANY PURPOSE, NOTIFY LAB WITHIN 5 DAYS. LOWEST DETECTABLE LIMITS FOR URINE DRUG SCREEN Drug Class                     Cutoff (ng/mL) Amphetamine and metabolites    1000 Barbiturate and metabolites    200 Benzodiazepine                 200 Tricyclics and metabolites     300 Opiates and metabolites        300 Cocaine and metabolites        300 THC  50 Performed at [facility], [address] [place], [place] [address]     No current facility-administered medications for this encounter.    Current Outpatient Medications  Medication Sig Dispense Refill  . mirtazapine (REMERON) 15 MG tablet Take 1 tablet (15 mg total) by mouth at bedtime. (Patient not taking: Reported on 10/03/2018) 30 tablet 0  . nicotine (NICODERM CQ - DOSED IN MG/24 HOURS) 21 mg/24hr patch Place 1 patch (21 mg total) onto the skin daily. (Patient not taking: Reported on 10/03/2018) 28 patch 0    Musculoskeletal: Strength & Muscle Tone: decreased Gait & Station: unsteady Patient leans:  Left  Psychiatric Specialty Exam: Physical Exam  Nursing note and vitals reviewed. Constitutional: He is oriented to person, place, and time. He appears well-developed and well-nourished.  HENT:  Head: Normocephalic.  Eyes: Pupils are equal, round, and reactive to light. Conjunctivae are normal.  Neck: Normal range of motion. Neck supple.  Cardiovascular: Normal rate.  Respiratory: Effort normal.  Musculoskeletal: Normal range of motion.  Neurological: He is alert and oriented to person, place, and time.  Skin: Skin is warm and dry.    Review of Systems  Psychiatric/Behavioral: Positive for depression and substance abuse. The patient is nervous/anxious.   All other systems reviewed and are negative.   Blood pressure (!) 146/75, pulse 66, temperature (!) 97.5 F (36.4 C), temperature source Oral, resp. rate 15, SpO2 100 %.There is no height or weight on file to calculate BMI.  General Appearance: Disheveled  Eye Contact:  Fair  Speech:  Garbled and Pressured  Volume:  Increased  Mood:  Anxious, Euphoric and Irritable  Affect:  Congruent, Flat and Inappropriate  Thought Process:  Irrelevant  Orientation:  Other:  person and place  Thought Content:  Illogical, Ilusions and Rumination  Suicidal Thoughts:  No  Homicidal Thoughts:  No  Memory:  Immediate;   Poor Recent;   Poor  Judgement:  Impaired  Insight:  Lacking  Psychomotor Activity:  Decreased  Concentration:  Concentration: Poor and Attention Span: Poor  Recall:  Poor  Fund of Knowledge:  Poor  Language:  Fair  Akathisia:  Negative  Handed:  Right  AIMS (if indicated):     Assets:  Desire for Improvement Social Support  ADL's:  Impaired  Cognition:  Impaired,  Mild  Sleep:        Treatment Plan Summary: Daily contact with patient to assess and evaluate symptoms and progress in treatment and Plan The patient currently meets carteria for inpatient psychiatric admission. The patient is a safety risk to self and  does require psychiatric inpatient admission for stabilization and treatment.   Disposition: Recommend psychiatric Inpatient admission when medically cleared. Supportive therapy provided about ongoing stressors.  Gillermo MurdochJacqueline Thompson, NP 10/03/2018 5:47 PM  Attest to NP note

## 2018-10-03 NOTE — ED Notes (Signed)
Dr Parke Poisson in to evaluate pt. Pt woke and able to answer questions then fell asleep.

## 2018-10-03 NOTE — ED Notes (Signed)
Pt yelled at staff while ambulating to the BR, "I'm going to restrain yall. I'm tired of these dumb ass white people". Pt ambulated to BR by nursing staff and security.

## 2018-10-03 NOTE — BH Assessment (Signed)
RN reports that the patient is not able to participate in the assessment due to takinig an overdose.

## 2018-10-03 NOTE — ED Notes (Signed)
Pt ambulated to BR with steady gait.

## 2018-10-03 NOTE — ED Notes (Addendum)
Pt states, "I am going to restrain y'all" (speaking to staff)! He also expressed that he was "tired of dumbass white people" (referring to staff).

## 2018-10-03 NOTE — ED Notes (Addendum)
Pt became aggressive with staff calling staff members "dumb ass" when staff members were not able to warm up food provided by the hospital cafeteria. Pt was provided a TV dinner and continued to be aggressive and yell at staff. Pt stated "I'm gonna restrain y'all."

## 2018-10-03 NOTE — Progress Notes (Addendum)
This patient continues to meet inpatient criteria. CSW faxed information to the following facilities:   [facility] No answer [facility] - No available beds [facility] - No answer [facility] - No available beds [facility] does not have appropriate bed availability at this time.    Stephanie Acre, [place] Social Worker [phone]

## 2018-10-03 NOTE — ED Provider Notes (Signed)
[name] DEPT Provider Note   CSN: [id number] Arrival date & time: 10/03/18  0218    History   Chief Complaint Chief Complaint  Patient presents with  . Drug Overdose    HPI Arthur Martinez is a 18 y.o. male.     18 year old male with a history of depression, tension headaches, insomnia presents to the ED by [facility].  There is concern from family about possible drug overdose.  He was found in a chair seemingly unresponsive.  Does have a history of drug abuse with reports of snorting heroin and Xanax.  Family states that the patient has been making suicidal threats.  He denies suicidal ideations during my encounter with him.  Also denies any drug ingestion tonight.  States that he was "going to smoke marijuana, but fell asleep".  Per RN, father en route to take out IVC paperwork.  The history is provided by the patient. No language interpreter was used.  Drug Overdose    Past Medical History:  Diagnosis Date  . Hypertension     Patient Active Problem List   Diagnosis Date Noted  . MDD (major depressive disorder) 08/30/2018  . Severe major depression, single episode, without psychotic features ([place]) 06/22/2018  . Tension headache 03/08/2014  . Anxiety state 03/08/2014  . Insomnia 03/08/2014  . Aggressive behavior 03/08/2014  . 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 CIRCUMCISION          Home Medications    Prior to Admission medications   Medication Sig Start Date End Date Taking? Authorizing Provider  mirtazapine (REMERON) 15 MG tablet Take 1 tablet (15 mg total) by mouth at bedtime. Patient not taking: Reported on 10/03/2018 09/02/18   Connye Burkitt, NP  nicotine (NICODERM CQ - DOSED IN MG/24 HOURS) 21 mg/24hr patch Place 1 patch (21 mg total) onto the skin daily. Patient not taking: Reported on 10/03/2018 09/03/18   Connye Burkitt, NP    Family History Family History  Problem Relation Age of Onset  . Heart attack Maternal Grandfather   . Bipolar disorder Maternal Grandfather   . Schizophrenia Maternal Grandfather   . Stroke Paternal Grandfather   . Depression Mother   . Anxiety disorder Mother   . ADD / ADHD Brother   . Depression Maternal Grandmother   . Anxiety disorder Maternal Grandmother   . Migraines Other     Social History Social History   Tobacco Use  . Smoking status: Current Every Day Smoker    Packs/day: 1.00    Years: 7.00    Pack years: 7.00    Types: Cigarettes  . Smokeless tobacco: Never Used  . Tobacco comment: Tried smoking and dipping once  Substance Use Topics  . Alcohol use: Not Currently  . Drug use: Yes    Frequency: 7.0 times per week    Types: Marijuana, Heroin     Allergies   Patient has no known allergies.   Review of Systems Review of Systems Ten systems reviewed and are negative for acute change, except as noted in the HPI.    Physical Exam Updated Vital Signs BP 120/65 (BP Location: Left Arm)   Pulse 66   Temp (!) 97.5 F (36.4 C) (Oral)   Resp 15   SpO2 100%   Physical Exam Vitals signs and nursing note reviewed.  Constitutional:      General: He is not in  acute distress.    Appearance: He is well-developed. He is not diaphoretic.     Comments: Alert to loud voice, sternal rubbing.  HENT:     Head: Normocephalic and atraumatic.  Eyes:     General: No scleral icterus.    Conjunctiva/sclera: Conjunctivae normal.  Neck:     Musculoskeletal: Normal range of motion.  Cardiovascular:     Rate and Rhythm: Normal rate and regular rhythm.     Pulses: Normal pulses.  Pulmonary:     Effort: Pulmonary effort is normal. No respiratory distress.     Comments: Respirations even and unlabored Musculoskeletal: Normal range of motion.  Skin:    General: Skin is warm and dry.     Coloration: Skin is not pale.     Findings: No erythema or rash.  Neurological:      Mental Status: He is alert and oriented to person, place, and time.     Comments: Speech is slurred.  He answers questions appropriately and follows commands.  Moving all extremities spontaneously.  Psychiatric:        Speech: Speech is slurred.        Behavior: Behavior is slowed.        Thought Content: Thought content does not include homicidal or suicidal ideation.      ED Treatments / Results  Labs (all labs ordered are listed, but only abnormal results are displayed) Labs Reviewed  CBC WITH DIFFERENTIAL/PLATELET - Abnormal; Notable for the following components:      Result Value   RBC 3.94 (*)    Hemoglobin 12.4 (*)    HCT 37.7 (*)    All other components within normal limits  ACETAMINOPHEN LEVEL - Abnormal; Notable for the following components:   Acetaminophen (Tylenol), Serum <10 (*)    All other components within normal limits  COMPREHENSIVE METABOLIC PANEL  ETHANOL  SALICYLATE LEVEL  RAPID URINE DRUG SCREEN, HOSP PERFORMED  OSMOLALITY    EKG None  Radiology No results found.  Procedures Procedures (including critical care time)  Medications Ordered in ED Medications - No data to display   Initial Impression / Assessment and Plan / ED Course  I have reviewed the triage vital signs and the nursing notes.  Pertinent labs & imaging results that were available during my care of the patient were reviewed by me and considered in my medical decision making (see chart for details).        18 year old male presents to the emergency department by EMS.  Family called EMS after the patient was found to be increasingly lethargic.  He is able to be aroused to sternal rubbing and loud voice.  Denies any substance ingestion tonight, but appears clinically intoxicated.  His UDS is pending.  Labs have otherwise been reassuring.  Police later brought IVC papers to the hospital.  A consultation to TTS was placed.  Pending psychiatric recommendations.  Disposition to be  determined by oncoming ED provider.   Final Clinical Impressions(s) / ED Diagnoses   Final diagnoses:  Substance-induced delirium [facility](HCC)  Involuntary commitment    ED Discharge Orders    None       Antony MaduraHumes, Anona Giovannini, PA-C 10/03/18 [id number]    Geoffery Lyonselo, [place], MD 10/04/18 [phone]

## 2018-10-03 NOTE — ED Notes (Signed)
Pt taking off wine colored scrubs stating again that he is leaving and he will not stay here any longer. Provider made aware of situation.

## 2018-10-03 NOTE — ED Provider Notes (Signed)
Patient became agitated while he was on the phone with his girlfriend.  He started making threats towards her staff and also threatened to leave.  He is under involuntary commitment because of overdose, polysubstance abuse and suicidal ideations  Patient required IM Geodon and physical restraints.  CRITICAL CARE Performed by: Davionna Blacksher   Total critical care time: 32 minutes  Critical care time was exclusive of separately billable procedures and treating other patients.  Critical care was necessary to treat or prevent imminent or life-threatening deterioration.  Critical care was time spent personally by me on the following activities: development of treatment plan with patient and/or surrogate as well as nursing, discussions with consultants, evaluation of patient's response to treatment, examination of patient, obtaining history from patient or surrogate, ordering and performing treatments and interventions, ordering and review of laboratory studies, ordering and review of radiographic studies, pulse oximetry and re-evaluation of patient's condition.    Varney Biles, MD 10/03/18 [phone]

## 2018-10-03 NOTE — ED Notes (Addendum)
This nurse came out of a patients room and found patient screaming at staff stating he is going to send his girlfriend in here to "whoop all of your asses" Pt screaming that he is going to leave and we cant stop him. Security has been called.

## 2018-10-03 NOTE — Progress Notes (Signed)
CSW contacted by [facility] and informed that clinical assessments had not been attached to referral packet. CSW faxed over medical assessment. Psychiatric assessment will need to be sent in order for [facility] to review case.   TTS will continue to seek placement.   Chalmers Guest. Guerry Bruin, MSW, [place] Work/Disposition Phone: [phone] Fax: [phone]

## 2018-10-03 NOTE — ED Notes (Signed)
Pt attempted to take restraints off. Security aware and standing by.

## 2018-10-03 NOTE — ED Notes (Signed)
Pt continues to sleep with deep snoring resp.

## 2018-10-03 NOTE — ED Notes (Signed)
Pt loud and using phone, yelling at person whom he is talking to. He is verbally loud and noncompliant with staff. Due to behavior limits are explained to pt. He is aware he has 1 min on phone then an injection will be given. He states they told me I could go to the bathroom to take a Sh**. Pt ambulated to bathroom, back to 17 then injection administered in rt deltoid. Pt provided breakfast and fell asleep prior to eating.

## 2018-10-03 NOTE — ED Triage Notes (Signed)
Pt arrived via [facility], with complaints of possible drug overdose. Family found pt in chair unresponsive but patient currently responsive to voice and painful stimuli. Pt has a hx of drug abuse with reports of snorting heroin and xanax. Family reports that the patient has been threatening suicide.  cbg with EMS 68

## 2018-10-03 NOTE — ED Notes (Signed)
Pt continues to sleep without noted distress

## 2018-10-03 NOTE — ED Notes (Addendum)
Patient ripped leads off. Patient changed into scrubs and belongings placed in bag at nursing station. Patient yelling and acting aggressive towards staff. Security at bedside.

## 2018-10-03 NOTE — ED Notes (Signed)
This RN was walking by when pt was noted to be out of his room and yelling about "wanting to leave because this is a prison" Pt screaming in the hall that he is going to leave.  This RN asked pt calmly to please return to his room, as he cannot leave.  Pt screaming at this RN that whoever we call to come help he will fight.  RN explained that no one wants to fight but that he could be charged if he attempted to harm staff, and no one wanted that course of action.  Pt screamed at this RN that he was "going to call my girlfriend up here to whoop your ass"

## 2018-10-04 ENCOUNTER — Encounter (HOSPITAL_COMMUNITY): Payer: Self-pay | Admitting: Registered Nurse

## 2018-10-04 DIAGNOSIS — F19188 Other psychoactive substance abuse with other psychoactive substance-induced disorder: Secondary | ICD-10-CM

## 2018-10-04 DIAGNOSIS — F05 Delirium due to known physiological condition: Secondary | ICD-10-CM

## 2018-10-04 DIAGNOSIS — F1910 Other psychoactive substance abuse, uncomplicated: Secondary | ICD-10-CM | POA: Diagnosis present

## 2018-10-04 NOTE — ED Notes (Addendum)
Meal tray at nurses station. Patient sleeping at this time.

## 2018-10-04 NOTE — Discharge Instructions (Signed)
For your mental health needs, you are advised to follow up with [facility].  You are advised to keep your previously scheduled appointment:       [facility]      [address]      [place], [place] [address]      [phone]      Crisis number: [phone]
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
## 2018-10-04 NOTE — ED Notes (Signed)
Patient ambulated to restroom with no problems. Patient cooperative.

## 2018-10-04 NOTE — ED Notes (Signed)
Patient given meal tray. Warmed up.

## 2018-10-04 NOTE — ED Notes (Signed)
Pt ambulated to the nurses station requesting a phone call, pt given snacks and a drink per request.

## 2018-10-04 NOTE — BH Assessment (Signed)
[facility] Assessment Progress Note  Per Buford Dresser, DO, this pt does not require psychiatric hospitalization at this time.  Pt presented at [facility] under IVC initiated by pt's father on 10/03/2018, however, a First Examination was not completed within 24 hours of pt's arrival, and the IVC expired.  Pt is to be discharged from [facility] with recommendation to follow up with [facility], where pt reportedly has an appointment scheduled.  This has been included in pt's discharge instructions.  Pt's nurse has been notified.  Jalene Mullet, [place] Triage Specialist [phone]

## 2018-10-04 NOTE — ED Notes (Signed)
No restraints are placed at this time. Patient is sleeping and restraints are not needed. Will continue to monitor.

## 2018-11-11 ENCOUNTER — Other Ambulatory Visit: Payer: Self-pay | Admitting: Advanced Practice Midwife

## 2018-11-11 MED ORDER — AZITHROMYCIN 500 MG PO TABS: 1000.0000 mg | ORAL_TABLET | Freq: Once | ORAL | 0 refills | Status: AC
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
## 2018-11-16 ENCOUNTER — Other Ambulatory Visit: Payer: Self-pay

## 2018-11-16 ENCOUNTER — Emergency Department (HOSPITAL_COMMUNITY)
Admission: EM | Admit: 2018-11-16 | Discharge: 2018-11-16 | Disposition: A | Discharge status: Home / Self Care | Payer: Medicaid Other | Attending: Emergency Medicine | Admitting: Emergency Medicine

## 2018-11-16 ENCOUNTER — Emergency Department (HOSPITAL_COMMUNITY): Payer: Medicaid Other

## 2018-11-16 ENCOUNTER — Encounter (HOSPITAL_COMMUNITY): Payer: Self-pay | Admitting: *Deleted

## 2018-11-16 DIAGNOSIS — M79641 Pain in right hand: Secondary | ICD-10-CM | POA: Diagnosis not present

## 2018-11-16 DIAGNOSIS — I10 Essential (primary) hypertension: Secondary | ICD-10-CM | POA: Diagnosis not present

## 2018-11-16 DIAGNOSIS — S0121XA Laceration without foreign body of nose, initial encounter: Secondary | ICD-10-CM | POA: Insufficient documentation

## 2018-11-16 DIAGNOSIS — Y9389 Activity, other specified: Secondary | ICD-10-CM | POA: Insufficient documentation

## 2018-11-16 DIAGNOSIS — S0181XA Laceration without foreign body of other part of head, initial encounter: Secondary | ICD-10-CM

## 2018-11-16 DIAGNOSIS — Z23 Encounter for immunization: Secondary | ICD-10-CM | POA: Insufficient documentation

## 2018-11-16 DIAGNOSIS — S01412A Laceration without foreign body of left cheek and temporomandibular area, initial encounter: Secondary | ICD-10-CM | POA: Diagnosis not present

## 2018-11-16 DIAGNOSIS — F17210 Nicotine dependence, cigarettes, uncomplicated: Secondary | ICD-10-CM | POA: Insufficient documentation

## 2018-11-16 DIAGNOSIS — S0993XA Unspecified injury of face, initial encounter: Secondary | ICD-10-CM | POA: Diagnosis present

## 2018-11-16 DIAGNOSIS — M79642 Pain in left hand: Secondary | ICD-10-CM | POA: Diagnosis not present

## 2018-11-16 DIAGNOSIS — Y929 Unspecified place or not applicable: Secondary | ICD-10-CM | POA: Insufficient documentation

## 2018-11-16 DIAGNOSIS — F12929 Cannabis use, unspecified with intoxication, unspecified: Secondary | ICD-10-CM | POA: Insufficient documentation

## 2018-11-16 DIAGNOSIS — S0083XA Contusion of other part of head, initial encounter: Secondary | ICD-10-CM

## 2018-11-16 DIAGNOSIS — Y999 Unspecified external cause status: Secondary | ICD-10-CM | POA: Insufficient documentation

## 2018-11-16 DIAGNOSIS — R6884 Jaw pain: Secondary | ICD-10-CM | POA: Insufficient documentation

## 2018-11-16 MED ORDER — LIDOCAINE-EPINEPHRINE (PF) 2 %-1:200000 IJ SOLN
INTRAMUSCULAR | Status: AC
  Filled 2018-11-16: qty 20

## 2018-11-16 MED ORDER — TETANUS-DIPHTH-ACELL PERTUSSIS 5-2.5-18.5 LF-MCG/0.5 IM SUSP
0.5000 mL | Freq: Once | INTRAMUSCULAR | Status: AC
  Administered 2018-11-16: 0.5 mL via INTRAMUSCULAR
  Filled 2018-11-16: qty 0.5

## 2018-11-16 MED ORDER — LIDOCAINE-EPINEPHRINE (PF) 2 %-1:200000 IJ SOLN
20.0000 mL | Freq: Once | INTRAMUSCULAR | Status: AC
  Administered 2018-11-16: 20 mL

## 2018-11-16 NOTE — ED Notes (Signed)
Called for triage. No answer. 
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
## 2019-01-14 ENCOUNTER — Emergency Department (HOSPITAL_COMMUNITY): Payer: Medicaid Other

## 2019-01-14 ENCOUNTER — Emergency Department (HOSPITAL_COMMUNITY)
Admission: EM | Admit: 2019-01-14 | Discharge: 2019-01-15 | Disposition: A | Discharge status: Other Acute Inpatient Hospital | Payer: Medicaid Other | Attending: Emergency Medicine | Admitting: Emergency Medicine

## 2019-01-14 ENCOUNTER — Encounter (HOSPITAL_COMMUNITY): Payer: Self-pay

## 2019-01-14 DIAGNOSIS — I10 Essential (primary) hypertension: Secondary | ICD-10-CM | POA: Insufficient documentation

## 2019-01-14 DIAGNOSIS — F17210 Nicotine dependence, cigarettes, uncomplicated: Secondary | ICD-10-CM | POA: Insufficient documentation

## 2019-01-14 DIAGNOSIS — F1199 Opioid use, unspecified with unspecified opioid-induced disorder: Secondary | ICD-10-CM | POA: Diagnosis not present

## 2019-01-14 DIAGNOSIS — Z046 Encounter for general psychiatric examination, requested by authority: Secondary | ICD-10-CM

## 2019-01-14 DIAGNOSIS — Z20828 Contact with and (suspected) exposure to other viral communicable diseases: Secondary | ICD-10-CM | POA: Insufficient documentation

## 2019-01-14 DIAGNOSIS — Z79899 Other long term (current) drug therapy: Secondary | ICD-10-CM | POA: Insufficient documentation

## 2019-01-14 DIAGNOSIS — F29 Unspecified psychosis not due to a substance or known physiological condition: Secondary | ICD-10-CM | POA: Diagnosis present

## 2019-01-14 DIAGNOSIS — F332 Major depressive disorder, recurrent severe without psychotic features: Secondary | ICD-10-CM | POA: Insufficient documentation

## 2019-01-14 DIAGNOSIS — Z008 Encounter for other general examination: Secondary | ICD-10-CM

## 2019-01-14 HISTORY — DX: Bipolar disorder, unspecified: F31.9

## 2019-01-14 LAB — COMPREHENSIVE METABOLIC PANEL
ALT: 19 U/L (ref 0–44)
AST: 19 U/L (ref 15–41)
Albumin: 4.4 g/dL (ref 3.5–5.0)
Alkaline Phosphatase: 81 U/L (ref 38–126)
Anion gap: 13 (ref 5–15)
BUN: 11 mg/dL (ref 6–20)
CO2: 27 mmol/L (ref 22–32)
Calcium: 9.3 mg/dL (ref 8.9–10.3)
Chloride: 96 mmol/L — ABNORMAL LOW (ref 98–111)
Creatinine, Ser: 1.07 mg/dL (ref 0.61–1.24)
GFR calc Af Amer: >60 mL/min (ref >60)
GFR calc non Af Amer: >60 mL/min (ref >60)
Glucose, Bld: 111 mg/dL — ABNORMAL HIGH (ref 70–99)
Potassium: 3.5 mmol/L (ref 3.5–5.1)
Sodium: 136 mmol/L (ref 135–145)
Total Bilirubin: 1 mg/dL (ref 0.3–1.2)
Total Protein: 8.1 g/dL (ref 6.5–8.1)

## 2019-01-14 LAB — CBC
HCT: 39.9 % (ref 39.0–52.0)
Hemoglobin: 13.5 g/dL (ref 13.0–17.0)
MCH: 31.2 pg (ref 26.0–34.0)
MCHC: 33.8 g/dL (ref 30.0–36.0)
MCV: 92.1 fL (ref 80.0–100.0)
Platelets: 288 10*3/uL (ref 150–400)
RBC: 4.33 MIL/uL (ref 4.22–5.81)
RDW: 12.7 % (ref 11.5–15.5)
WBC: 10.8 10*3/uL — ABNORMAL HIGH (ref 4.0–10.5)
nRBC: 0 % (ref 0.0–0.2)

## 2019-01-14 LAB — SALICYLATE LEVEL: Salicylate Lvl: <7 mg/dL (ref 2.8–30.0)

## 2019-01-14 LAB — RAPID URINE DRUG SCREEN, HOSP PERFORMED
Amphetamines: NOT DETECTED
Barbiturates: NOT DETECTED
Benzodiazepines: NOT DETECTED
Cocaine: NOT DETECTED
Opiates: POSITIVE — AB
Tetrahydrocannabinol: POSITIVE — AB

## 2019-01-14 LAB — ETHANOL: Alcohol, Ethyl (B): <10 mg/dL (ref <10)

## 2019-01-14 LAB — ACETAMINOPHEN LEVEL: Acetaminophen (Tylenol), Serum: <10 ug/mL — ABNORMAL LOW (ref 10–30)

## 2019-01-14 MED ORDER — ZIPRASIDONE MESYLATE 20 MG IM SOLR: 10.0000 mg | INTRAMUSCULAR | Status: DC | PRN

## 2019-01-14 MED ORDER — ACETAMINOPHEN 325 MG PO TABS
650.0000 mg | ORAL_TABLET | Freq: Once | ORAL | Status: AC
  Administered 2019-01-14: 650 mg via ORAL
  Filled 2019-01-14: qty 2
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
## 2019-01-14 NOTE — ED Notes (Signed)
Pt becoming increasingly irritated.

## 2019-01-14 NOTE — ED Notes (Signed)
Pt given turkey sandwich and drink 
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
## 2019-01-14 NOTE — ED Notes (Signed)
Pt wanded °

## 2019-01-14 NOTE — ED Notes (Signed)
Pt changed out into gown, no purple scrubs available.

## 2019-01-15 LAB — SARS CORONAVIRUS 2 (TAT 6-24 HRS): SARS Coronavirus 2: NEGATIVE

## 2019-01-15 MED ORDER — ACETAMINOPHEN 325 MG PO TABS: 650.0000 mg | ORAL_TABLET | ORAL | Status: DC | PRN

## 2019-01-15 MED ORDER — NICOTINE 21 MG/24HR TD PT24
21.0000 mg | MEDICATED_PATCH | Freq: Every day | TRANSDERMAL | Status: DC
  Administered 2019-01-15: 21 mg via TRANSDERMAL
  Filled 2019-01-15: qty 1

## 2019-01-15 MED ORDER — DIVALPROEX SODIUM 250 MG PO DR TAB
500.0000 mg | DELAYED_RELEASE_TABLET | Freq: Every day | ORAL | Status: DC
  Administered 2019-01-15: 500 mg via ORAL
  Filled 2019-01-15: qty 2

## 2019-01-15 MED ORDER — ALPRAZOLAM ER 1 MG PO TB24: 1.0000 mg | ORAL_TABLET | Freq: Three times a day (TID) | ORAL | Status: DC | PRN

## 2019-01-15 MED ORDER — ONDANSETRON HCL 4 MG PO TABS: 4.0000 mg | ORAL_TABLET | Freq: Three times a day (TID) | ORAL | Status: DC | PRN
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
## 2019-01-15 NOTE — Progress Notes (Signed)
Patient meets criteria for inpatient treatment per Lindon Romp, NP. CSW faxed referrals to the following facilities for review:  [facility]   [facility]   [facility]   [facility]   [facility]   [facility]    TSS will continue to seek bed placement.   Darletta Moll MSW, [place] Worker Disposition  [facility] Ph: [phone] Fax: [phone]
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
## 2019-01-15 NOTE — ED Notes (Signed)
Pt picking at cut he received before coming to the hospital on his right index finger. Pt bleeding. Site was washed and bandaged.

## 2019-01-15 NOTE — ED Notes (Signed)
Lunch ordered 

## 2019-01-15 NOTE — ED Notes (Signed)
Patient verbalizes understanding of discharge instructions. Opportunity for questioning and answers were provided. Armband removed by staff, pt discharged from ED.  

## 2019-01-15 NOTE — Progress Notes (Signed)
Per Christinia Gully at [facility] pt. has been accepted to [place] at [facility]. Accepting provider is Elaina Hoops.  Patient can come as soon as transportation arrives. Number for report is [phone]. Patient is negative for COVID-19 01/15/2019.  Darletta Moll MSW, [place] Worker Disposition  [facility] Ph: [phone] Fax: [phone]

## 2019-01-15 NOTE — ED Notes (Signed)
Transport here for pt 

## 2019-01-15 NOTE — ED Notes (Signed)
Breakfast ordered for patient  

## 2019-01-15 NOTE — ED Notes (Signed)
All paperwork faxed, 3 copies in the pt's folder, 1 in MR and the originals in the red folder.

## 2019-01-30 ENCOUNTER — Other Ambulatory Visit: Payer: Self-pay

## 2019-01-30 ENCOUNTER — Emergency Department (HOSPITAL_COMMUNITY)
Admission: EM | Admit: 2019-01-30 | Discharge: 2019-01-31 | Disposition: A | Discharge status: Home / Self Care | Payer: Medicaid Other | Attending: Emergency Medicine | Admitting: Emergency Medicine

## 2019-01-30 ENCOUNTER — Encounter (HOSPITAL_COMMUNITY): Payer: Self-pay | Admitting: Emergency Medicine

## 2019-01-30 ENCOUNTER — Emergency Department (HOSPITAL_COMMUNITY): Payer: Medicaid Other

## 2019-01-30 DIAGNOSIS — R45850 Homicidal ideations: Secondary | ICD-10-CM | POA: Insufficient documentation

## 2019-01-30 DIAGNOSIS — M549 Dorsalgia, unspecified: Secondary | ICD-10-CM | POA: Diagnosis not present

## 2019-01-30 DIAGNOSIS — S00511A Abrasion of lip, initial encounter: Secondary | ICD-10-CM | POA: Insufficient documentation

## 2019-01-30 DIAGNOSIS — Z79899 Other long term (current) drug therapy: Secondary | ICD-10-CM | POA: Insufficient documentation

## 2019-01-30 DIAGNOSIS — F3111 Bipolar disorder, current episode manic without psychotic features, mild: Secondary | ICD-10-CM | POA: Diagnosis not present

## 2019-01-30 DIAGNOSIS — R451 Restlessness and agitation: Secondary | ICD-10-CM | POA: Insufficient documentation

## 2019-01-30 DIAGNOSIS — Y999 Unspecified external cause status: Secondary | ICD-10-CM | POA: Insufficient documentation

## 2019-01-30 DIAGNOSIS — S0003XA Contusion of scalp, initial encounter: Secondary | ICD-10-CM | POA: Insufficient documentation

## 2019-01-30 DIAGNOSIS — U071 COVID-19: Secondary | ICD-10-CM | POA: Diagnosis not present

## 2019-01-30 DIAGNOSIS — Y939 Activity, unspecified: Secondary | ICD-10-CM | POA: Insufficient documentation

## 2019-01-30 DIAGNOSIS — S0993XA Unspecified injury of face, initial encounter: Secondary | ICD-10-CM | POA: Diagnosis present

## 2019-01-30 DIAGNOSIS — M542 Cervicalgia: Secondary | ICD-10-CM | POA: Diagnosis not present

## 2019-01-30 DIAGNOSIS — Y92410 Unspecified street and highway as the place of occurrence of the external cause: Secondary | ICD-10-CM | POA: Insufficient documentation

## 2019-01-30 DIAGNOSIS — F17210 Nicotine dependence, cigarettes, uncomplicated: Secondary | ICD-10-CM | POA: Insufficient documentation

## 2019-01-30 DIAGNOSIS — R220 Localized swelling, mass and lump, head: Secondary | ICD-10-CM | POA: Insufficient documentation

## 2019-01-30 DIAGNOSIS — Z046 Encounter for general psychiatric examination, requested by authority: Secondary | ICD-10-CM | POA: Diagnosis not present

## 2019-01-30 DIAGNOSIS — I10 Essential (primary) hypertension: Secondary | ICD-10-CM | POA: Diagnosis not present

## 2019-01-30 DIAGNOSIS — F1994 Other psychoactive substance use, unspecified with psychoactive substance-induced mood disorder: Secondary | ICD-10-CM | POA: Diagnosis not present

## 2019-01-30 LAB — COMPREHENSIVE METABOLIC PANEL
ALT: 24 U/L (ref 0–44)
AST: 27 U/L (ref 15–41)
Albumin: 3.4 g/dL — ABNORMAL LOW (ref 3.5–5.0)
Alkaline Phosphatase: 83 U/L (ref 38–126)
Anion gap: 11 (ref 5–15)
BUN: 9 mg/dL (ref 6–20)
CO2: 28 mmol/L (ref 22–32)
Calcium: 9.4 mg/dL (ref 8.9–10.3)
Chloride: 103 mmol/L (ref 98–111)
Creatinine, Ser: 0.94 mg/dL (ref 0.61–1.24)
GFR calc Af Amer: >60 mL/min (ref >60)
GFR calc non Af Amer: >60 mL/min (ref >60)
Glucose, Bld: 85 mg/dL (ref 70–99)
Potassium: 3.8 mmol/L (ref 3.5–5.1)
Sodium: 142 mmol/L (ref 135–145)
Total Bilirubin: 0.3 mg/dL (ref 0.3–1.2)
Total Protein: 6.8 g/dL (ref 6.5–8.1)

## 2019-01-30 LAB — SALICYLATE LEVEL: Salicylate Lvl: <7 mg/dL (ref 2.8–30.0)

## 2019-01-30 LAB — RAPID URINE DRUG SCREEN, HOSP PERFORMED
Amphetamines: NOT DETECTED
Barbiturates: NOT DETECTED
Benzodiazepines: POSITIVE — AB
Cocaine: NOT DETECTED
Opiates: NOT DETECTED
Tetrahydrocannabinol: POSITIVE — AB

## 2019-01-30 LAB — CBC
HCT: 35.3 % — ABNORMAL LOW (ref 39.0–52.0)
Hemoglobin: 11.6 g/dL — ABNORMAL LOW (ref 13.0–17.0)
MCH: 31 pg (ref 26.0–34.0)
MCHC: 32.9 g/dL (ref 30.0–36.0)
MCV: 94.4 fL (ref 80.0–100.0)
Platelets: 310 10*3/uL (ref 150–400)
RBC: 3.74 MIL/uL — ABNORMAL LOW (ref 4.22–5.81)
RDW: 12.6 % (ref 11.5–15.5)
WBC: 8.7 10*3/uL (ref 4.0–10.5)
nRBC: 0 % (ref 0.0–0.2)

## 2019-01-30 LAB — SARS CORONAVIRUS 2 (TAT 6-24 HRS): SARS Coronavirus 2: POSITIVE — AB

## 2019-01-30 LAB — ACETAMINOPHEN LEVEL: Acetaminophen (Tylenol), Serum: <10 ug/mL — ABNORMAL LOW (ref 10–30)

## 2019-01-30 LAB — ETHANOL: Alcohol, Ethyl (B): <10 mg/dL (ref <10)

## 2019-01-30 MED ORDER — STERILE WATER FOR INJECTION IJ SOLN
INTRAMUSCULAR | Status: AC
  Administered 2019-01-30: 1.2 mL
  Filled 2019-01-30: qty 10

## 2019-01-30 MED ORDER — ZIPRASIDONE MESYLATE 20 MG IM SOLR
10.0000 mg | Freq: Once | INTRAMUSCULAR | Status: AC
  Administered 2019-01-30: 03:00:00 10 mg via INTRAMUSCULAR
  Filled 2019-01-30: qty 20

## 2019-01-30 NOTE — ED Notes (Signed)
TTS in process 

## 2019-01-30 NOTE — ED Notes (Signed)
1st Exam completed for IVC paperwork - Copy faxed to [facility] - Copy sent to Medical Records - Original placed in folder for [place] 3 sets on clipboard.

## 2019-01-30 NOTE — ED Notes (Signed)
Pharm Tech in w/pt. 

## 2019-01-30 NOTE — ED Notes (Signed)
[name] [phone] girlfriend asking for an update on the patient

## 2019-01-30 NOTE — ED Notes (Signed)
Requested for Dr Eulis Foster to order Home Meds.

## 2019-01-30 NOTE — BHH Counselor (Signed)
Awaiting collateral contact. Disposition is pending. VM left for [name] and aunt.  Lorenza [place], [facility] Triage Specialist

## 2019-01-30 NOTE — ED Notes (Signed)
RN attempted to call Father to inform of +Covid test; No answer and v/m left-Monique,RN

## 2019-01-30 NOTE — BH Specialist Note (Signed)
Received TTS consult request. Per RN, Pt has been given medication and is currently drowsy. [facility] staff will contact TTS at [phone] when Pt is alert and able to participate in assessment.   Evelena Peat, [facility], [facility] Triage Specialist [phone]

## 2019-01-30 NOTE — ED Notes (Signed)
Pt called aunt and is now resting calmly in the bed eating a [place] sandwich.

## 2019-01-30 NOTE — BHH Counselor (Signed)
Attempted assessment.  Pt still asleep/sedated.  Asked [facility] to call when Pt is ready.

## 2019-01-30 NOTE — ED Notes (Signed)
Pt belongings were placed in locker number 3

## 2019-01-30 NOTE — ED Notes (Signed)
[facility] called this RN requesting to TTS pt. This RN advised that pt was asleep, sedated on geodon. [facility] requested a phone call when pt was awake and able to undergo TTS. This RN agreed to call back or pass along the message to day shift RN.

## 2019-01-30 NOTE — ED Notes (Signed)
Pt requesting my name and where I live. Pt stating "he knows people outside of here and that I should be scared."

## 2019-01-30 NOTE — ED Notes (Signed)
Pt arrived to Rm 52 via stretcher. Pt noted to be wearing burgundy scrubs. Pt aware of tx plan - overnight obs and reassess in AM. Pt sitting on bed watching tv.
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
## 2019-01-30 NOTE — ED Notes (Signed)
Per CT, unable to perform scan until patient is more alert and able to follow commands.

## 2019-01-30 NOTE — BH Assessment (Addendum)
Arthur Loveless, NP, reviewed pt's chart and information listened to the provided collateral from pt's father and determined pt should be observed overnight for safety and stability. There are currently no appropriate beds available for pt at [facility], so pt will remain at [facility] ED. This information was provided to pt's nurse, [place], at [phone].

## 2019-01-30 NOTE — ED Notes (Signed)
Pt's belongings inventoried - 2 labeled belongings bags and 1 black back pack - placed in [facility] [facility] - NO Valuables noted.

## 2019-01-30 NOTE — ED Notes (Signed)
Pt ripped mask off and threw it on the bed. Myself and sitter asked pt to put mask back on pt stated "he did not have to."
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
## 2019-01-30 NOTE — ED Notes (Signed)
Pt is getting loud and agitated in the hallway. Pt is pacing back and forth. I asked pt to please have a seat and pt told me "I dont have to do anything and I'm not scared of you." Pt advised if he does not calm down MD will be notified.
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
## 2019-01-30 NOTE — BH Assessment (Signed)
Tele Assessment Note   Patient Name: Arthur Martinez MRN: [id number] Referring Physician: Dr. Vanita Panda Location of Patient: [facility] Location of Provider: [place] is an 18 y.o. male. The Pt was IVCd by his father. Per IVC the Pt stated that he was going to burn down the house with everyone in it. Pt denies the above referenced. Per Pt he was recently D/C from [place] on Tuesday. Per Pt he had verbal altercation with his father's girlfriend and she called the police to ask him to leave. Per Pt the police told her he didn't have to leave because he resided there. Pt states that his father then had him IVCd. Per Pt whenever his father gets upset with him he places him under IVC. Pt reports constant conflict with his father. Pt reports numerous hospitalizations.  Pt reports marijuana use. Pt has been aggressive towards ED staff.  VM left for [name] and aunt.   Per Arthur Nutting, NP collateral information has to be obtained before disposition is made.   Diagnosis:  Per Epic notes: Bipolar F31.11  Past Medical History:  Past Medical History:  Diagnosis Date  . Bipolar 1 disorder ([place])   . Hypertension     Past Surgical History:  Procedure Laterality Date  . CIRCUMCISION      Family History:  Family History  Problem Relation Age of Onset  . Heart attack Maternal Grandfather   . Bipolar disorder Maternal Grandfather   . Schizophrenia Maternal Grandfather   . Stroke Paternal Grandfather   . Depression Mother   . Anxiety disorder Mother   . ADD / ADHD Brother   . Depression Maternal Grandmother   . Anxiety disorder Maternal Grandmother   . Migraines Other     Social History:  reports that he has been smoking cigarettes. He has a 7.00 pack-year smoking history. He has never used smokeless tobacco. He reports previous alcohol use. He reports current drug use. Frequency: 7.00 times per week. Drugs: Marijuana and Heroin.  Additional  Social History:  Alcohol / Drug Use Pain Medications: please see mar Prescriptions: please see mar Over the Counter: please see mar History of alcohol / drug use?: Yes Longest period of sobriety (when/how long): unknown Substance #1 Name of Substance 1: opiates 1 - Age of First Use: unknown 1 - Amount (size/oz): unknown 1 - Frequency: unknown 1 - Duration: ongoing 1 - Last Use / Amount: unknown Substance #2 Name of Substance 2: marijuana 2 - Age of First Use: unknown 2 - Amount (size/oz): unknown 2 - Frequency: ongoing 2 - Duration: unknown 2 - Last Use / Amount: unknown  CIWA: CIWA-Ar BP: (!) 126/56 Pulse Rate: 81 COWS:    Allergies: No Known Allergies  Home Medications: (Not in a hospital admission)   OB/GYN Status:  No LMP for male patient.  General Assessment Data Assessment unable to be completed: Yes Reason for not completing assessment: Pt medicated and unable to participate. Location of Assessment: [facility] ED TTS Assessment: In system Is this a Tele or Face-to-Face Assessment?: Tele Assessment Is this an Initial Assessment or a Re-assessment for this encounter?: Initial Assessment Patient Accompanied by:: N/A Language Other than English: No Living Arrangements: Other (Comment) What gender do you identify as?: Male Marital status: Single Maiden name: NA Pregnancy Status: No Living Arrangements: Parent Can pt return to current living arrangement?: No Admission Status: Involuntary Petitioner: Other Is patient capable of signing voluntary admission?: No Referral Source: Other Insurance type:  Medicaid     Crisis Care Plan Living Arrangements: Parent Legal Guardian: Other: Name of Psychiatrist: NA Name of Therapist: NA  Education Status Is patient currently in school?: No Is the patient employed, unemployed or receiving disability?: Unemployed  Risk to self with the past 6 months Suicidal Ideation: No Has patient been a risk to self within the past 6  months prior to admission? : No Suicidal Intent: No Has patient had any suicidal intent within the past 6 months prior to admission? : No Is patient at risk for suicide?: No Suicidal Plan?: No Has patient had any suicidal plan within the past 6 months prior to admission? : No Access to Means: No Specify Access to Suicidal Means: NA What has been your use of drugs/alcohol within the last 12 months?: marijuana and past use of heroin Previous Attempts/Gestures: No How many times?: 0 Other Self Harm Risks: NA Triggers for Past Attempts: Other (Comment) Intentional Self Injurious Behavior: (pt denies) Family Suicide History: No Recent stressful life event(s): Conflict (Comment) Persecutory voices/beliefs?: No Depression: No Depression Symptoms: (pt denies) Substance abuse history and/or treatment for substance abuse?: Yes Suicide prevention information given to non-admitted patients: Not applicable  Risk to Others within the past 6 months Homicidal Ideation: No Does patient have any lifetime risk of violence toward others beyond the six months prior to admission? : No Thoughts of Harm to Others: ( per IVC but Pt denies) Current Homicidal Intent: No Current Homicidal Plan: No Access to Homicidal Means: No Identified Victim: (IVC stated family) History of harm to others?: No Assessment of Violence: None Noted Violent Behavior Description: NA Does patient have access to weapons?: No Criminal Charges Pending?: Yes Describe Pending Criminal Charges: Pt cannot recall Does patient have a court date: Yes Is patient on probation?: No  Psychosis Hallucinations: None noted Delusions: None noted  Mental Status Report Appearance/Hygiene: In scrubs Eye Contact: Fair Motor Activity: Freedom of movement Speech: Logical/coherent, Other (Comment) Level of Consciousness: Alert Mood: Irritable Affect: Irritable Anxiety Level: Minimal Thought Processes: Coherent, Relevant Judgement:  Unimpaired Orientation: Person, Place, Time, Situation Obsessive Compulsive Thoughts/Behaviors: None  Cognitive Functioning Concentration: Normal Memory: Recent Intact, Remote Intact Is patient IDD: No Insight: Poor Impulse Control: Poor Appetite: Fair Have you had any weight changes? : No Change Sleep: No Change Total Hours of Sleep: 8 Vegetative Symptoms: None  ADLScreening [facility] Assessment Services) Patient's cognitive ability adequate to safely complete daily activities?: Yes Patient able to express need for assistance with ADLs?: Yes Independently performs ADLs?: Yes (appropriate for developmental age)  Prior Inpatient Therapy Prior Inpatient Therapy: Yes Prior Therapy Dates: 01/2019 Prior Therapy Facilty/Provider(s): [facility], [facility]. [facility] Reason for Treatment: Substance use treatment, SI.  Prior Outpatient Therapy Prior Outpatient Therapy: No Does patient have an ACCT team?: No Does patient have Intensive In-House Services?  : No Does patient have [facility] services? : No Does patient have P4CC services?: No  ADL Screening (condition at time of admission) Patient's cognitive ability adequate to safely complete daily activities?: Yes Is the patient deaf or have difficulty hearing?: No Does the patient have difficulty seeing, even when wearing glasses/contacts?: No Does the patient have difficulty concentrating, remembering, or making decisions?: No Patient able to express need for assistance with ADLs?: Yes Does the patient have difficulty dressing or bathing?: No Independently performs ADLs?: Yes (appropriate for developmental age)       Abuse/Neglect Assessment (Assessment to be complete while patient is alone) Abuse/Neglect Assessment Can Be Completed: Yes Physical  Abuse: Denies Verbal Abuse: Denies Sexual Abuse: Denies Exploitation of patient/patient's resources: Denies     Merchant navy officer (For Healthcare) Does Patient Have a Medical Advance  Directive?: No Would patient like information on creating a medical advance directive?: No - Patient declined          Disposition:  Disposition Initial Assessment Completed for this Encounter: Yes  This service was provided via telemedicine using a 2-way, interactive audio and video technology.  Names of all persons participating in this telemedicine service and their role in this encounter. Name: Hillery Jacks Role: NP  Name:  Role:   Name:  Role:   Name:  Role:     Emmit Pomfret 01/30/2019 1:36 PM

## 2019-01-30 NOTE — ED Notes (Addendum)
Message sent to Dr Eulis Foster and Charge RN notifying of positive COVID-19 result. Sitter aware.

## 2019-01-30 NOTE — ED Notes (Signed)
Attempted to wake pt for urine sample and COVID testing. Pt woke up long enough to give permission for the COVID test and then his eyes rolled back in his head and he laid down. I attempted again to wake the pt but was unsuccessful.

## 2019-01-30 NOTE — ED Notes (Signed)
Patient arrived in police custody; pt has been cooperative thus far.

## 2019-01-31 DIAGNOSIS — U071 COVID-19: Secondary | ICD-10-CM

## 2019-01-31 DIAGNOSIS — F1994 Other psychoactive substance use, unspecified with psychoactive substance-induced mood disorder: Secondary | ICD-10-CM

## 2019-01-31 DIAGNOSIS — R45850 Homicidal ideations: Secondary | ICD-10-CM

## 2019-01-31 MED ORDER — HYDROXYZINE HCL 25 MG PO TABS: 25.0000 mg | ORAL_TABLET | Freq: Four times a day (QID) | ORAL | Status: DC | PRN

## 2019-01-31 MED ORDER — DIVALPROEX SODIUM ER 250 MG PO TB24: 250.0000 mg | ORAL_TABLET | Freq: Every day | ORAL | Status: DC

## 2019-01-31 MED ORDER — TRAZODONE HCL 50 MG PO TABS: 50.0000 mg | ORAL_TABLET | Freq: Every day | ORAL | Status: DC

## 2019-01-31 MED ORDER — HYDROXYZINE PAMOATE 25 MG PO CAPS: 25.0000 mg | ORAL_CAPSULE | Freq: Four times a day (QID) | ORAL | Status: DC | PRN

## 2019-01-31 MED ORDER — DIVALPROEX SODIUM ER 500 MG PO TB24: 500.0000 mg | ORAL_TABLET | Freq: Every morning | ORAL | Status: DC

## 2019-01-31 MED ORDER — GABAPENTIN 300 MG PO CAPS: 300.0000 mg | ORAL_CAPSULE | Freq: Every day | ORAL | Status: DC

## 2019-01-31 MED ORDER — OLANZAPINE 5 MG PO TABS: 10.0000 mg | ORAL_TABLET | Freq: Every day | ORAL | Status: DC

## 2019-01-31 NOTE — ED Triage Notes (Signed)
TC to Pt Father . Father will pick Pt up at 1230

## 2019-01-31 NOTE — ED Notes (Signed)
Diet was ordered for Lunch. 

## 2019-01-31 NOTE — ED Triage Notes (Signed)
Pt Aunt called Dept. This morning after a call was made by 3rd shift staff to report to family pt was Covid -3 positive. Pt'[s Aunt was informed that pt tested Positive for COVID -19.

## 2019-01-31 NOTE — Progress Notes (Signed)
Patient ID: Arthur Martinez, male   DOB: [birth year], 18 y.o.   MRN: [id number]  Reassessment   HPI per chart review: Arthur Martinez is an 18 y.o. male. The Pt was IVCd by his father. Per IVC the Pt stated that he was going to burn down the house with everyone in it. Pt denies the above referenced. Per Pt he was recently D/C from [place] on Tuesday. Per Pt he had verbal altercation with his father's girlfriend and she called the police to ask him to leave. Per Pt the police told her he didn't have to leave because he resided there. Pt states that his father then had him IVCd. Per Pt whenever his father gets upset with him he places him under IVC. Pt reports constant conflict with his father. Pt reports numerous hospitalizations.  Pt reports marijuana use.   Psychiatric reassessment Patient reassessed by  This provider via tele psych. During this evaluation, he is alert and oriented x4, calm and cooperative. Patient reports that he does not understand why he is in the ED. He reports he was upset after he wanted to spend time with his father and his father acted as though he didn't want to spend time so he left to go to his sisters home. States,: then the police came and got me." As noted above, patient was IVC'd by his father after making threats to burn down the house with everyone in it. Patient denies this. He does however note that he made suicidal comments a few weeks ago and he was admitted to [facility] and discharged last Tuesday. He notes that he was been compliant with his discharge medications although he had been unable to follow-up with [facility] as he was taken back to the ED. He admits to opiate abuse although states is was prescribed Subutex while at [facility]. He denies current SI, HI or psychosis.   Patients case was discussed with [facility] clinical team. Based on the information provided, patient does not meet criteria for inpatient psychiatric hospitalization. I spoke to patients  father Arthur Martinez to update him on current disposition. He stated that patient did in-fact make the threats to burn down the house and hurt himself although he may no attempts. He reports the incident occurred last Saturday. Father was receptive to disposition although voices concerns about patient needing long-term rehab. Patient states he does not believe he need rehab for his opoid abuse. I will however, asked LCSW to fax over resources.  Patients father was provided with additional information;  Discharge Recommendations:  1. The patient is being psychiatrically cleared. Father is aware and states he can pick patient up at 12:00 pm today.  2. Patient is to take his medications as prescribed unless told otherwise by outpatient provider.  3. Patient should be monitored for suicidal ideation and If the patient's symptoms worsen or do not continue to improve or if the patient becomes actively suicidal or homicidal then it is recommended that the patient return to the closest hospital emergency room or call 911 for further evaluation and treatment. National Suicide Prevention Lifeline 1800-SUICIDE or [phone]. 4. The patient should abstain from all illicit substances and alcohol. 5. Family was educated about removing/locking any firearms, medications or dangerous products from the home.  I informed patients father that he was COVID positive. Per patients father, he had been made aware by patients Aunt. We discussed the need for patient to self- quarantine for 10-14 days and for close contacts to  monitor for symptoms and be obtain a COVID test. Patients father had no concerns with patient returning home.  Dr. Alvino Chapel made aware of current disposition.

## 2019-01-31 NOTE — ED Notes (Signed)
Patient verbalizes understanding of discharge instructions . Opportunity for questions and answers were provided . Armband removed by staff ,Pt discharged from ED. W/C  offered at D/C  and Declined W/C at D/C and was escorted to lobby by RN.  

## 2019-01-31 NOTE — ED Triage Notes (Signed)
Pt cleared and Dc home . Pt Father called 3 time to pick up him up. Pt wanted to go out side to smoke. Pt has instructions for COVID+19. And [place] .

## 2019-01-31 NOTE — ED Provider Notes (Signed)
  Physical Exam  BP 134/74 (BP Location: Right Arm)   Pulse 90   Temp 98.5 F (36.9 C) (Oral)   Resp 19   Ht 5\' 8"  (1.727 m)   Wt 68 kg   SpO2 98%   BMI 22.81 kg/m   Physical Exam  ED Course/Procedures     Procedures  MDM  Patient's been seen by psychiatry and cleared for discharge.       Davonna Belling, MD 01/31/19 1025

## 2019-01-31 NOTE — ED Notes (Signed)
Patent was given a Drink and Snack.

## 2019-01-31 NOTE — Discharge Instructions (Signed)
Follow-up as discussed for the psychiatric issues.

## 2019-02-02 ENCOUNTER — Other Ambulatory Visit: Payer: Self-pay

## 2019-02-02 ENCOUNTER — Emergency Department (HOSPITAL_COMMUNITY)
Admission: EM | Admit: 2019-02-02 | Discharge: 2019-02-02 | Disposition: A | Discharge status: Home / Self Care | Payer: Medicaid Other | Attending: Emergency Medicine | Admitting: Emergency Medicine

## 2019-02-02 ENCOUNTER — Emergency Department (HOSPITAL_COMMUNITY): Payer: Medicaid Other

## 2019-02-02 DIAGNOSIS — F17210 Nicotine dependence, cigarettes, uncomplicated: Secondary | ICD-10-CM | POA: Diagnosis not present

## 2019-02-02 DIAGNOSIS — U071 COVID-19: Secondary | ICD-10-CM | POA: Diagnosis not present

## 2019-02-02 DIAGNOSIS — R0602 Shortness of breath: Secondary | ICD-10-CM | POA: Diagnosis present

## 2019-02-02 NOTE — ED Triage Notes (Signed)
[facility], HI attempt, pt c/o of Sob 4 am, no hx of asthma, confirmed covid positive.  V/s on arrival 148/90, spo2 98 room air, pluse 94. From home.

## 2019-02-03 NOTE — ED Provider Notes (Signed)
Emergency Department Provider Note   I have reviewed the triage vital signs and the nursing notes.   HISTORY  Chief Complaint Shortness of Breath   HPI Arthur Martinez is a 18 y.o. male who was recently diagnosed with coronavirus and states he had an episode today where he felt short of breath so he presents here for further evaluation.  He is asymptomatic this time.  No fevers.  No nausea, vomiting, headache or other associated symptoms.   No other associated or modifying symptoms.    Past Medical History:  Diagnosis Date  . Bipolar 1 disorder ([facility])   . Hypertension     Patient Active Problem List   Diagnosis Date Noted  . Polysubstance abuse ([facility]) 10/04/2018  . MDD (major depressive disorder) 08/30/2018  . Severe major depression, single episode, without psychotic features ([facility]) 06/22/2018  . Tension headache 03/08/2014  . Anxiety state 03/08/2014  . Insomnia 03/08/2014  . Aggressive behavior 03/08/2014  . 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 CIRCUMCISION      Current Outpatient Rx  . Order #: [id number] Class: Historical Med  . Order #: [id number] Class: Historical Med  . Order #: [id number] Class: Historical Med  . Order #: [id number] Class: Historical Med  . Order #: [id number] Class: Historical Med  . Order #: [id number] Class: Historical Med  . Order #: [id number] Class: Historical Med  . Order #: [id number] Class: Normal  . Order #: [id number] Class: Normal    Allergies Tylenol [acetaminophen]  Family History  Problem Relation Age of Onset  . Heart attack Maternal Grandfather   . Bipolar disorder Maternal Grandfather   . Schizophrenia Maternal Grandfather   . Stroke Paternal Grandfather   . Depression Mother   . Anxiety disorder Mother   . ADD / ADHD Brother   . Depression Maternal Grandmother   . Anxiety disorder Maternal Grandmother   . Migraines Other      Social History Social History   Tobacco Use  . Smoking status: Current Every Day Smoker    Packs/day: 1.00    Years: 7.00    Pack years: 7.00    Types: Cigarettes  . Smokeless tobacco: Never Used  . Tobacco comment: Tried smoking and dipping once  Substance Use Topics  . Alcohol use: Not Currently  . Drug use: Yes    Frequency: 7.0 times per week    Types: Marijuana, Heroin    Review of Systems  All other systems negative except as documented in the HPI. All pertinent positives and negatives as reviewed in the HPI. ____________________________________________   PHYSICAL EXAM:  VITAL SIGNS: ED Triage Vitals  Enc Vitals Group     BP 02/02/19 0552 (!) 151/81     Pulse Rate 02/02/19 0552 82     Resp 02/02/19 0552 16     Temp 02/02/19 0552 98.6 F (37 C)     Temp Source 02/02/19 0552 Oral     SpO2 02/02/19 0502 95 %     Weight --      Height --      Head Circumference --      Peak Flow --      Pain Score 02/02/19 0502 0     Pain Loc --      Pain Edu? --      Excl. in GC? --     Constitutional: Alert and oriented. Well appearing and in no acute distress. Eyes: Conjunctivae are normal. PERRL.  EOMI. Head: Atraumatic. Nose: No congestion/rhinnorhea. Mouth/Throat: Mucous membranes are moist.  Oropharynx non-erythematous. Neck: No stridor.  No meningeal signs.   Cardiovascular: Normal rate, regular rhythm. Good peripheral circulation. Grossly normal heart sounds.   Respiratory: Normal respiratory effort.  No retractions. Lungs CTAB. Gastrointestinal: Soft and nontender. No distention.  Musculoskeletal: No lower extremity tenderness nor edema. No gross deformities of extremities. Neurologic:  Normal speech and language. No gross focal neurologic deficits are appreciated.  Skin:  Skin is warm, dry and intact. No rash noted.   ____________________________________________   LABS (all labs ordered are listed, but only abnormal results are displayed)  Labs Reviewed -  No data to display ____________________________________________  EKG   ____________________________________________  RADIOLOGY  Dg Chest Portable 1 View  Result Date: 02/02/2019 CLINICAL DATA:  Shortness of breath and fever. EXAM: PORTABLE CHEST 1 VIEW COMPARISON:  05/22/2017 FINDINGS: The heart size and mediastinal contours are within normal limits. Both lungs are clear. The visualized skeletal structures are unremarkable. IMPRESSION: Normal chest x-ray. Electronically Signed   By: Marijo Sanes M.D.   On: 02/02/2019 05:41    ____________________________________________   PROCEDURES  Procedure(s) performed:   Procedures   ____________________________________________   INITIAL IMPRESSION / ASSESSMENT AND PLAN / ED COURSE  Patient overall appears well with clear lung sounds and no evidence respiratory distress.  Vital signs are within normal limits making it very unlikely that he has hypoxic respiratory failure or anything else requiring admission.  Patient was offered education on when to return to the hospital.  No indication for further imaging or work-up at this time.     Pertinent labs & imaging results that were available during my care of the patient were reviewed by me and considered in my medical decision making (see chart for details).  A medical screening exam was performed and I feel the patient has had an appropriate workup for their chief complaint at this time and likelihood of emergent condition existing is low. They have been counseled on decision, discharge, follow up and which symptoms necessitate immediate return to the emergency department. They or their family verbally stated understanding and agreement with plan and discharged in stable condition.   ____________________________________________  FINAL CLINICAL IMPRESSION(S) / ED DIAGNOSES  Final diagnoses:  [id number]     MEDICATIONS GIVEN DURING THIS VISIT:  Medications - No data to display   NEW  OUTPATIENT MEDICATIONS STARTED DURING THIS VISIT:  Discharge Medication List as of 02/02/2019  5:37 AM      Note:  This note was prepared with assistance of Dragon voice recognition software. Occasional wrong-word or sound-a-like substitutions may have occurred due to the inherent limitations of voice recognition software.   Cabria Micalizzi, Corene Cornea, MD 02/03/19 Laureen Abrahams

## 2019-03-02 ENCOUNTER — Emergency Department (HOSPITAL_COMMUNITY)
Admission: EM | Admit: 2019-03-02 | Discharge: 2019-03-03 | Disposition: A | Discharge status: Other Acute Inpatient Hospital | Payer: Medicaid Other | Attending: Emergency Medicine | Admitting: Emergency Medicine

## 2019-03-02 ENCOUNTER — Other Ambulatory Visit: Payer: Self-pay

## 2019-03-02 ENCOUNTER — Encounter (HOSPITAL_COMMUNITY): Payer: Self-pay | Admitting: Emergency Medicine

## 2019-03-02 DIAGNOSIS — F1910 Other psychoactive substance abuse, uncomplicated: Secondary | ICD-10-CM | POA: Insufficient documentation

## 2019-03-02 DIAGNOSIS — I10 Essential (primary) hypertension: Secondary | ICD-10-CM | POA: Insufficient documentation

## 2019-03-02 DIAGNOSIS — Z20828 Contact with and (suspected) exposure to other viral communicable diseases: Secondary | ICD-10-CM | POA: Insufficient documentation

## 2019-03-02 DIAGNOSIS — F329 Major depressive disorder, single episode, unspecified: Secondary | ICD-10-CM | POA: Insufficient documentation

## 2019-03-02 DIAGNOSIS — Z79899 Other long term (current) drug therapy: Secondary | ICD-10-CM | POA: Insufficient documentation

## 2019-03-02 DIAGNOSIS — Z046 Encounter for general psychiatric examination, requested by authority: Secondary | ICD-10-CM | POA: Insufficient documentation

## 2019-03-02 DIAGNOSIS — F17210 Nicotine dependence, cigarettes, uncomplicated: Secondary | ICD-10-CM | POA: Insufficient documentation

## 2019-03-02 LAB — CBC
HCT: 39.3 % (ref 39.0–52.0)
Hemoglobin: 12.9 g/dL — ABNORMAL LOW (ref 13.0–17.0)
MCH: 31.2 pg (ref 26.0–34.0)
MCHC: 32.8 g/dL (ref 30.0–36.0)
MCV: 95.2 fL (ref 80.0–100.0)
Platelets: 305 10*3/uL (ref 150–400)
RBC: 4.13 MIL/uL — ABNORMAL LOW (ref 4.22–5.81)
RDW: 12.7 % (ref 11.5–15.5)
WBC: 9.1 10*3/uL (ref 4.0–10.5)
nRBC: 0 % (ref 0.0–0.2)

## 2019-03-02 LAB — COMPREHENSIVE METABOLIC PANEL
ALT: 17 U/L (ref 0–44)
AST: 24 U/L (ref 15–41)
Albumin: 3.9 g/dL (ref 3.5–5.0)
Alkaline Phosphatase: 83 U/L (ref 38–126)
Anion gap: 9 (ref 5–15)
BUN: 9 mg/dL (ref 6–20)
CO2: 28 mmol/L (ref 22–32)
Calcium: 9.3 mg/dL (ref 8.9–10.3)
Chloride: 103 mmol/L (ref 98–111)
Creatinine, Ser: 1.4 mg/dL — ABNORMAL HIGH (ref 0.61–1.24)
GFR calc Af Amer: >60 mL/min (ref >60)
GFR calc non Af Amer: >60 mL/min (ref >60)
Glucose, Bld: 96 mg/dL (ref 70–99)
Potassium: 4.4 mmol/L (ref 3.5–5.1)
Sodium: 140 mmol/L (ref 135–145)
Total Bilirubin: <0.1 mg/dL — ABNORMAL LOW (ref 0.3–1.2)
Total Protein: 7.4 g/dL (ref 6.5–8.1)

## 2019-03-02 LAB — ETHANOL: Alcohol, Ethyl (B): <10 mg/dL (ref <10)

## 2019-03-02 NOTE — ED Triage Notes (Signed)
Patient reports ETOH intake with 1 xanax and marijuana today , somnolent at arrival , poor historian due to intoxication , respirations unlabored/ambulatory .

## 2019-03-03 ENCOUNTER — Inpatient Hospital Stay (HOSPITAL_COMMUNITY)
Admission: RE | Admit: 2019-03-03 | Discharge: 2019-03-07 | DRG: 885 | Disposition: A | Discharge status: Home / Self Care | Payer: Medicaid Other | Attending: Psychiatry | Admitting: Psychiatry

## 2019-03-03 ENCOUNTER — Encounter (HOSPITAL_COMMUNITY): Payer: Self-pay | Admitting: Psychiatry

## 2019-03-03 DIAGNOSIS — F17210 Nicotine dependence, cigarettes, uncomplicated: Secondary | ICD-10-CM | POA: Diagnosis present

## 2019-03-03 DIAGNOSIS — F329 Major depressive disorder, single episode, unspecified: Secondary | ICD-10-CM | POA: Diagnosis present

## 2019-03-03 DIAGNOSIS — F1123 Opioid dependence with withdrawal: Secondary | ICD-10-CM | POA: Diagnosis present

## 2019-03-03 DIAGNOSIS — T424X2A Poisoning by benzodiazepines, intentional self-harm, initial encounter: Secondary | ICD-10-CM | POA: Diagnosis present

## 2019-03-03 DIAGNOSIS — Z886 Allergy status to analgesic agent status: Secondary | ICD-10-CM | POA: Diagnosis not present

## 2019-03-03 DIAGNOSIS — F319 Bipolar disorder, unspecified: Principal | ICD-10-CM | POA: Diagnosis present

## 2019-03-03 DIAGNOSIS — F419 Anxiety disorder, unspecified: Secondary | ICD-10-CM | POA: Diagnosis present

## 2019-03-03 DIAGNOSIS — R451 Restlessness and agitation: Secondary | ICD-10-CM | POA: Diagnosis present

## 2019-03-03 DIAGNOSIS — Z8249 Family history of ischemic heart disease and other diseases of the circulatory system: Secondary | ICD-10-CM | POA: Diagnosis not present

## 2019-03-03 DIAGNOSIS — Z818 Family history of other mental and behavioral disorders: Secondary | ICD-10-CM | POA: Diagnosis not present

## 2019-03-03 DIAGNOSIS — I10 Essential (primary) hypertension: Secondary | ICD-10-CM | POA: Diagnosis present

## 2019-03-03 DIAGNOSIS — F332 Major depressive disorder, recurrent severe without psychotic features: Secondary | ICD-10-CM | POA: Diagnosis not present

## 2019-03-03 DIAGNOSIS — G4700 Insomnia, unspecified: Secondary | ICD-10-CM | POA: Diagnosis present

## 2019-03-03 LAB — RAPID URINE DRUG SCREEN, HOSP PERFORMED
Amphetamines: NOT DETECTED
Barbiturates: NOT DETECTED
Benzodiazepines: POSITIVE — AB
Cocaine: NOT DETECTED
Opiates: POSITIVE — AB
Tetrahydrocannabinol: POSITIVE — AB

## 2019-03-03 LAB — RESPIRATORY PANEL BY RT PCR (FLU A&B, COVID)
Influenza A by PCR: NEGATIVE
Influenza B by PCR: NEGATIVE
SARS Coronavirus 2 by RT PCR: NEGATIVE

## 2019-03-03 MED ORDER — DICYCLOMINE HCL 20 MG PO TABS
20.0000 mg | ORAL_TABLET | Freq: Four times a day (QID) | ORAL | Status: DC | PRN
  Administered 2019-03-03 – 2019-03-06 (×6): 20 mg via ORAL
  Filled 2019-03-03 (×6): qty 1

## 2019-03-03 MED ORDER — TRAZODONE HCL 50 MG PO TABS
50.0000 mg | ORAL_TABLET | Freq: Every evening | ORAL | Status: DC | PRN
  Administered 2019-03-03 – 2019-03-06 (×5): 50 mg via ORAL
  Filled 2019-03-03 (×4): qty 1

## 2019-03-03 MED ORDER — TRAZODONE HCL 50 MG PO TABS
ORAL_TABLET | ORAL | Status: AC
  Filled 2019-03-03: qty 1

## 2019-03-03 MED ORDER — PNEUMOCOCCAL VAC POLYVALENT 25 MCG/0.5ML IJ INJ: 0.5000 mL | INJECTION | INTRAMUSCULAR | Status: DC

## 2019-03-03 MED ORDER — ONDANSETRON 4 MG PO TBDP: 4.0000 mg | ORAL_TABLET | Freq: Four times a day (QID) | ORAL | Status: DC | PRN

## 2019-03-03 MED ORDER — NICOTINE 21 MG/24HR TD PT24
21.0000 mg | MEDICATED_PATCH | Freq: Every day | TRANSDERMAL | Status: DC
  Administered 2019-03-03: 21 mg via TRANSDERMAL
  Filled 2019-03-03 (×7): qty 1

## 2019-03-03 MED ORDER — LOPERAMIDE HCL 2 MG PO CAPS: 2.0000 mg | ORAL_CAPSULE | ORAL | Status: DC | PRN

## 2019-03-03 MED ORDER — METHOCARBAMOL 500 MG PO TABS
500.0000 mg | ORAL_TABLET | Freq: Three times a day (TID) | ORAL | Status: DC | PRN
  Administered 2019-03-03 – 2019-03-06 (×6): 500 mg via ORAL
  Filled 2019-03-03 (×6): qty 1

## 2019-03-03 MED ORDER — NALOXONE HCL 2 MG/2ML IJ SOSY
2.0000 mg | PREFILLED_SYRINGE | Freq: Once | INTRAMUSCULAR | Status: AC
  Administered 2019-03-03: 2 mg via INTRAVENOUS

## 2019-03-03 MED ORDER — NAPROXEN 500 MG PO TABS
500.0000 mg | ORAL_TABLET | Freq: Two times a day (BID) | ORAL | Status: DC | PRN
  Administered 2019-03-03 – 2019-03-06 (×3): 500 mg via ORAL
  Filled 2019-03-03 (×3): qty 1

## 2019-03-03 MED ORDER — HYDROXYZINE HCL 25 MG PO TABS
25.0000 mg | ORAL_TABLET | Freq: Four times a day (QID) | ORAL | Status: DC | PRN
  Administered 2019-03-03 – 2019-03-06 (×6): 25 mg via ORAL
  Filled 2019-03-03 (×7): qty 1

## 2019-03-03 NOTE — ED Notes (Signed)
TTS attempted °

## 2019-03-03 NOTE — Progress Notes (Signed)
Patient ID: Arthur Martinez, male   DOB: Mar 14, 2000, 18 y.o.   MRN: [id number] Admission Note  Pt is a 18 yo male that presents IVC'd on 03/03/2019 with worsening depression, anxiety, and a drug overdose following the death of the pt's brother 1 year ago. Pt states that the brothers birthday is coming up and it is hard this time of year. Pt also states they ran out of their suboxone recently which is why they relapsed on heroin. Pt denies that they were revived with narcan x2. Pt is guarded and minimal with poor eye contact. Pt has a angry affect. Pt endorses taking their medications as they were prescribed until they ran out. Pt denies past/present verbal/physical/sexual abuse. Pt pt denies present self neglect. Pt endorses employment. Pt endorses the use of heroin. Pt states they are an occ drinker having 2 beers at a time. Pt is a ppd smoker. Pt denies Rx abuse. Pt states they live with their father. Pt states their aunt and mother are the support systems though. Pt is dating, and they are expecting a baby girl soon. Pt denies this as a stressor. Pt denies being suicidal before the overdose. Pt endorses being suicidal 1 year ago when the brother died with no plan. Pt denies current si/hi/ah/vh and verbally agrees to approach staff if these become apparent or before harming themself/others while at [place] signed, skin/belongings search completed and patient oriented to unit. Patient stable at this time. Patient given the opportunity to express concerns and ask questions. Patient given toiletries. Will continue to monitor.   Per TTS 12/31:  Arthur Martinez is an 18 y.o. male. Pt presented to [facility] voluntarily unaccompanied. Pt was asked what brought him in today, pt states, "I don't know ,I took too many xanaxs". Per pt chart pt has histroy of polysubstance abuse. Pt presents tired during assessment and slightly irritated. Pt answers questions with short responses.  Pt was asked about his current  state of mind, home life, mental health, substance use and SI. Pt denied SI, HI, AVH and self injurious behaviors. Per pt's chart history he has a history of cutting, multiple hospitalizations and attempts of overdosing on opiates. Pt denied any current alcohol use. Pt current UDS positive for Opiates, Benzo's and marijuana. Pt denies any history of abuse/trauma for himself and family. Pt does report that he has family issues with his mother but does not go into detail about conflict. Pt reports feeling worthless, angry and irritable and says he has not slept well in days, reports on 3 to 4 hours of sleep daily and having a poor appetite. Per chart history pt also has experienced several panic attacks in the past as well as Pt states that he is grieving loss of his younger brother Erlene Quan who died earlier this year as well the loss of his "second" mother. Pt reports having multiple court dates coming up in Jan 2020 for assault. Pt reports he currently has no provider and is not taking any medications, but had therapist in the past a few years ago.  Collateral:  TTS contacted pt's father Nghia Mcentee at [phone]. [name] reports that he got a call from pt's girlfriend stating that pt said he wanted to kill himself on his brothers birthday. His younger brother who was shot earlier this year birthday is on January 31st. [name] reports that pt has ongoing drug abuse and is in denial and needs help grieving loss of brother. He states pt was  given Narcan last night because he overdose on heroin and xanax. [name] reports he feels that pt can not keep himself safe and that pt needs long term inpatient treatment to treat his severe use of opiates.

## 2019-03-03 NOTE — Progress Notes (Signed)
Patient ID: Arthur Martinez, male   DOB: 10-Jan-2001, 18 y.o.   MRN: [id number]   Called patients father, Kushal Saunders to discuss patients medications. As per father, he is unsure of the medications patient should be taking however, he reports refuses to take any medications once he is discharged from any hospital.

## 2019-03-03 NOTE — ED Notes (Signed)
Provided pt with sandwich bag and drink.  ? ?

## 2019-03-03 NOTE — BH Assessment (Signed)
Tele Assessment Note   Patient Name: Arthur Martinez MRN: [id number] Referring Physician: Linwood Dibbles, MD Location of Patient: [facility] Location of Provider: Behavioral Health TTS Department  Arthur Martinez is an 18 y.o. male. Pt presented to [facility] voluntarily unaccompanied. Pt was asked what brought him in today, pt states, "I don't know ,I took too many xanaxs". Per pt chart pt has histroy of polysubstance abuse. Pt presents tired during assessment and slightly irritated. Pt answers questions with short responses.  Pt was asked about his current state of mind, home life, mental health, substance use and SI. Pt denied SI, HI, AVH and self injurious behaviors. Per pt's chart history he has a history of cutting, multiple hospitalizations and attempts of overdosing on opiates. Pt denied any current alcohol use. Pt current UDS positive for Opiates, Benzo's and marijuana. Pt denies any history of abuse/trauma for himself and family. Pt does report that he has family issues with his mother but does not go into detail about conflict. Pt reports feeling worthless, angry and irritable and says he has not slept well in days, reports on 3 to 4 hours of sleep daily and having a poor appetite. Per chart history pt also has experienced several panic attacks in the past as well as Pt states that he is grieving loss of his younger brother Arthur Martinez who died earlier this year as well the loss of his "second" mother. Pt reports having multiple court dates coming up in Jan 2020 for assault. Pt reports he currently has no provider and is not taking any medications, but had therapist in the past a few years ago.  Collateral:  TTS contacted pt's father Arthur Martinez at [phone]. [name] reports that he got a call from pt's girlfriend stating that pt said he wanted to kill himself on his brothers birthday. His younger brother who was shot earlier this year birthday is on January 31st. [name] reports that pt has  ongoing drug abuse and is in denial and needs help grieving loss of brother. He states pt was given Narcan last night because he overdose on heroin and xanax. [name] reports he feels that pt can not keep himself safe and that pt needs long term inpatient treatment to treat his severe use of opiates.    Diagnosis: Major Depressive Disorder, recurrent, severe without psychotic features. Opioid use Disorder, severe.  Past Medical History:  Past Medical History:  Diagnosis Date  . Bipolar 1 disorder (HCC)   . Hypertension     Past Surgical History:  Procedure Laterality Date  . CIRCUMCISION      Family History:  Family History  Problem Relation Age of Onset  . Heart attack Maternal Grandfather   . Bipolar disorder Maternal Grandfather   . Schizophrenia Maternal Grandfather   . Stroke Paternal Grandfather   . Depression Mother   . Anxiety disorder Mother   . ADD / ADHD Brother   . Depression Maternal Grandmother   . Anxiety disorder Maternal Grandmother   . Migraines Other     Social History:  reports that he has been smoking cigarettes. He has a 7.00 pack-year smoking history. He has never used smokeless tobacco. He reports previous alcohol use. He reports current drug use. Frequency: 7.00 times per week. Drugs: Marijuana and Heroin.  Additional Social History:  Alcohol / Drug Use Pain Medications: see MAR Prescriptions: see MAR Over the Counter: see MAR History of alcohol / drug use?: Yes Substance #1 Name of Substance 1:  Xanax  CIWA: CIWA-Ar BP: 134/71 Pulse Rate: 83 COWS:    Allergies:  Allergies  Allergen Reactions  . Tylenol [Acetaminophen] Hives and Itching    Home Medications: (Not in a hospital admission)   OB/GYN Status:  No LMP for male patient.  General Assessment Data Assessment unable to be completed: Yes Reason for not completing assessment: [facility] attempted to complete assessment.  Pt was unable to arouse long enough to complete assessment.   Pt states "I'm sleep I hadn't slept can you come back at a later time." TTS will complete assessment at a later time. Location of Assessment: [facility] ED TTS Assessment: In system Is this a Tele or Face-to-Face Assessment?: Tele Assessment Is this an Initial Assessment or a Re-assessment for this encounter?: Initial Assessment Patient Accompanied by:: N/A Language Other than English: No Living Arrangements: Other (Comment) What gender do you identify as?: Male Marital status: Single Admission Status: Voluntary     Crisis Care Plan Name of Psychiatrist: NA Name of Therapist: NA  Education Status Is patient currently in school?: No Is the patient employed, unemployed or receiving disability?: Employed  Risk to self with the past 6 months Suicidal Ideation: No-Not Currently/Within Last 6 Months Has patient been a risk to self within the past 6 months prior to admission? : No Suicidal Intent: No Has patient had any suicidal intent within the past 6 months prior to admission? : No Is patient at risk for suicide?: No Suicidal Plan?: No Has patient had any suicidal plan within the past 6 months prior to admission? : No Access to Means: No  Risk to Others within the past 6 months Homicidal Ideation: No Does patient have any lifetime risk of violence toward others beyond the six months prior to admission? : No Thoughts of Harm to Others: No Current Homicidal Intent: No Current Homicidal Plan: No Access to Homicidal Means: No History of harm to others?: No Assessment of Violence: None Noted Does patient have access to weapons?: No Criminal Charges Pending?: Yes Describe Pending Criminal Charges: assault Does patient have a court date: Yes Court Date: (2020)              Prior Inpatient Therapy Prior Inpatient Therapy: Yes Prior Therapy Dates: 01/2019 Prior Therapy Facilty/Provider(s): [facility], [facility] [place] Reason for Treatment: Substance use treatment, SI.  Prior  Outpatient Therapy Prior Outpatient Therapy: No Does patient have an ACCT team?: No Does patient have Intensive In-House Services?  : No Does patient have [facility] services? : No Does patient have P4CC services?: No                Advance Directives (For Healthcare) Does Patient Have a Medical Advance Directive?: No Would patient like information on creating a medical advance directive?: No - Guardian declined          Disposition: Per Dr. Mallie Darting, pt meets inpatient criteria. Per [facility] Heather, pt accepted to [facility] bed 304-01. TTS confirmed status with attending provider.    This service was provided via telemedicine using a 2-way, interactive audio and video technology.  Names of all persons participating in this telemedicine service and their role in this encounter. Name: Arthur Martinez Role: Patient  Name:Damary Doland Role: TTS Counselor  Name:  Role:   Name:  Role:     Donato Heinz 03/03/2019 11:18 AM

## 2019-03-03 NOTE — ED Notes (Signed)
Called another phone number for [employer] [phone] will send for transport.

## 2019-03-03 NOTE — ED Notes (Signed)
[facility] [phone]. Office closed until 03/07/19. Called and left message at [phone] and emailed [email or web address] to transport a patient to behavioral health.

## 2019-03-03 NOTE — ED Notes (Signed)
Lunch Tray Ordered @ 1108.  

## 2019-03-03 NOTE — ED Notes (Signed)
Father called for update. Father is requesting assistance in finding Pt a long term treatment facility and requests a call with [facility] or anyone who can help facilitate the request.

## 2019-03-03 NOTE — Progress Notes (Signed)
   03/03/19 0749  General Assessment Data  Reason for not completing assessment [facility] attempted to complete assessment.  Pt was unable to arouse long enough to complete assessment.  Pt states "I'm sleep I hadn't slept can you come back at a later time." TTS will complete assessment at a later time.   Chayanne Speir L. [place], [place], [facility], [facility] Therapeutic Triage Specialist  [phone]

## 2019-03-03 NOTE — ED Provider Notes (Signed)
Pt is awake.  Asking for something to eat.  Medically cleared at this time.   Dorie Rank, MD 03/03/19 1135

## 2019-03-03 NOTE — ED Notes (Signed)
Report given to Cristal Deer at [facility] room 304-1.

## 2019-03-03 NOTE — ED Notes (Signed)
Per Ida,NT she found small zip lock filled with white powder substance. NT Gave to security.

## 2019-03-03 NOTE — Progress Notes (Signed)
Psychoeducational Group Note  Date:  03/03/2019 Time:  2030  Group Topic/Focus:  wrap up group  Participation Level: Did Not Attend  Participation Quality:  Not Applicable  Affect:  Not Applicable  Cognitive:  Not Applicable  Insight:  Not Applicable  Engagement in Group: Not Applicable  Additional Comments:  Pt was notified that group was beginning but remained in bed.   Winfield Rast S 03/03/2019, 9:03 PM

## 2019-03-03 NOTE — ED Provider Notes (Signed)
[facility]ENCY [facility] Provider Note   CSN: [id number] Arrival date & time: 03/02/19  2157     History Chief Complaint  Patient presents with  . ETOH with Xanax/Marijuana    [place] NickelJalen Riley Azevedo is a 18 y.o. male.  HPI     This is a 18 year old male with reported history of polysubstance abuse, major depressive disorder who presents with his father.  Father is concerned that he is using drugs to try to kill himself.  Father reports that his mother had to administer Narcan earlier this evening.  He reports that over the last month this is his third administration of Narcan.  One of the patient's "friends" reported that he stated he was going to try to kill himself on his brother's birthday which is tomorrow per his father.  His father is very concerned regarding his drug abuse.  Patient is somnolent but arousable.  He is difficult to obtain history from.  He denies suicidality.  He states "if you tell them that they will think I am crazy."  Level 5 caveat for somnolence and acuity of condition.  Past Medical History:  Diagnosis Date  . Bipolar 1 disorder (HCC)   . Hypertension     Patient Active Problem List   Diagnosis Date Noted  . Polysubstance abuse (HCC) 10/04/2018  . MDD (major depressive disorder) 08/30/2018  . Severe major depression, single episode, without psychotic features (HCC) 06/22/2018  . Tension headache 03/08/2014  . Anxiety state 03/08/2014  . Insomnia 03/08/2014  . Aggressive behavior 03/08/2014  . 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 CIRCUMCISION         Family History  Problem Relation Age of Onset  . Heart attack Maternal Grandfather   . Bipolar disorder Maternal Grandfather   . Schizophrenia Maternal Grandfather   . Stroke Paternal Grandfather   . Depression Mother   . Anxiety disorder Mother   . ADD / ADHD Brother   .  Depression Maternal Grandmother   . Anxiety disorder Maternal Grandmother   . Migraines Other     Social History   Tobacco Use  . Smoking status: Current Every Day Smoker    Packs/day: 1.00    Years: 7.00    Pack years: 7.00    Types: Cigarettes  . Smokeless tobacco: Never Used  . Tobacco comment: Tried smoking and dipping once  Substance Use Topics  . Alcohol use: Not Currently  . Drug use: Yes    Frequency: 7.0 times per week    Types: Marijuana, Heroin    Home Medications Prior to Admission medications   Medication Sig Start Date End Date Taking? Authorizing Provider  Buprenorphine HCl-Naloxone HCl (SUBOXONE) 4-1 MG FILM Place 1 Film under the tongue 2 (two) times daily.    [provider]  divalproex (DEPAKOTE ER) 250 MG 24 hr tablet Take 250 mg by mouth at bedtime. 01/25/19   [provider]  divalproex (DEPAKOTE ER) 500 MG 24 hr tablet Take 500 mg by mouth every morning.  01/25/19   [provider]  gabapentin (NEURONTIN) 300 MG capsule Take 300 mg by mouth at bedtime.  01/25/19   [provider]  hydrOXYzine (VISTARIL) 25 MG capsule Take 25 mg by mouth every 6 (six) hours as needed for anxiety.  01/25/19   [provider]  mirtazapine (REMERON) 15 MG tablet Take 1 tablet (15 mg total) by mouth  at bedtime. Patient not taking: Reported on 01/15/2019 09/02/18   Connye Burkitt, NP  nicotine (NICODERM CQ - DOSED IN MG/24 HOURS) 21 mg/24hr patch Place 1 patch (21 mg total) onto the skin daily. Patient not taking: Reported on 01/15/2019 09/03/18   Connye Burkitt, NP  OLANZapine (ZYPREXA) 10 MG tablet Take 10 mg by mouth at bedtime. 01/25/19   [provider]  traZODone (DESYREL) 50 MG tablet Take 50 mg by mouth at bedtime. 01/25/19   [provider]    Allergies    Tylenol [acetaminophen]  Review of Systems   Review of Systems  Unable to perform ROS: Mental status change    Physical Exam Updated Vital Signs BP  (!) 114/48   Pulse 77   Temp 97.9 F (36.6 C) (Oral)   Resp 11   SpO2 97%   Physical Exam Vitals and nursing note reviewed.  Constitutional:      Appearance: He is well-developed.     Comments: Somnolent but arousable, minimally contributory to history taking  HENT:     Head: Normocephalic and atraumatic.     Nose: Nose normal.  Eyes:     Pupils: Pupils are equal, round, and reactive to light.     Comments: Pupils 2 mm and minimally reactive bilaterally  Cardiovascular:     Rate and Rhythm: Normal rate and regular rhythm.     Heart sounds: Normal heart sounds. No murmur.  Pulmonary:     Effort: Pulmonary effort is normal. No respiratory distress.     Breath sounds: Normal breath sounds. No wheezing.  Abdominal:     General: Bowel sounds are normal.     Palpations: Abdomen is soft.     Tenderness: There is no abdominal tenderness. There is no rebound.  Musculoskeletal:     Cervical back: Neck supple.     Right lower leg: No edema.     Left lower leg: No edema.  Skin:    General: Skin is warm and dry.  Neurological:     Mental Status: He is oriented to person, place, and time.  Psychiatric:     Comments: Uncooperative, appears under the influence     ED Results / Procedures / Treatments   Labs (all labs ordered are listed, but only abnormal results are displayed) Labs Reviewed  COMPREHENSIVE METABOLIC PANEL - Abnormal; Notable for the following components:      Result Value   Creatinine, Ser 1.40 (*)    Total Bilirubin <0.1 (*)    All other components within normal limits  CBC - Abnormal; Notable for the following components:   RBC 4.13 (*)    Hemoglobin 12.9 (*)    All other components within normal limits  RAPID URINE DRUG SCREEN, HOSP PERFORMED - Abnormal; Notable for the following components:   Opiates POSITIVE (*)    Benzodiazepines POSITIVE (*)    Tetrahydrocannabinol POSITIVE (*)    All other components within normal limits  RESPIRATORY PANEL BY RT PCR  (FLU A&B, COVID)  ETHANOL    EKG EKG Interpretation  Date/Time:  Thursday March 03 2019 00:44:25 EST Ventricular Rate:  77 PR Interval:    QRS Duration: 76 QT Interval:  367 QTC Calculation: 416 R Axis:   71 Text Interpretation: Sinus rhythm Probable left atrial enlargement No significant change since last tracing Confirmed by Thayer Jew [id number]) on 03/03/2019 3:44:26 AM   Radiology No results found.  Procedures Procedures (including critical care time)  Medications Ordered in ED  Medications  naloxone (NARCAN) injection 2 mg (2 mg Intravenous Given 03/03/19 0013)    ED Course  I have reviewed the triage vital signs and the nursing notes.  Pertinent labs & imaging results that were available during my care of the patient were reviewed by me and considered in my medical decision making (see chart for details).  Clinical Course as of Mar 02 645  Thu Mar 03, 2019  0011 On second evaluation, patient is somnolent and drooling on himself, he is minimally arousable, pupils are 2 mm.  I had requested nursing move him immediately to a monitored bed and administer Narcan.   [CH]  0350 Patient still sleeping and minimally arousable.  End-tidal CO2 48.  We will continue to monitor.  Likely a combination of drugs given positive opiates, benzos, and THC.   [CH]  [phone] Patient now arousable.  Will order breakfast.  Will have TTS consulted given reportedly saying he wanted to kill himself.   [CH]    Clinical Course User Index [CH] Rowene Suto, Mayer Masker, MD   MDM Rules/Calculators/A&P                       Patient presents with polysubstance abuse.  Reportedly, mother used Narcan to revive him at home.  He has had multiple overdoses in the last month requiring Narcan.  Patient is very somnolent on my initial assessment.  He denies SI but there is some report that he told a friend that he "wanted to kill himself on his brother's birthday."  After my initial assessment, I assessed  him approximately 30 minutes later and he was drooling and pupils were pinpoint.  He was given Narcan.  This had minimal effect.  Suspect this is polysubstance abuse including benzodiazepines.  Patient was placed on a monitor and allowed to sleep overnight.  Lab work-up notable for positive opiates, benzodiazepines, and marijuana.  Otherwise his lab work-up is reassuring.  He is medically clear for TTS evaluation.  Currently he is voluntary.  Final Clinical Impression(s) / ED Diagnoses Final diagnoses:  Polysubstance abuse [facility])    Rx / DC Orders ED Discharge Orders    None       Tarell Schollmeyer, Mayer Masker, MD 03/03/19 [phone]

## 2019-03-03 NOTE — Tx Team (Signed)
Initial Treatment Plan 03/03/2019 5:06 PM Lowella Dandy Rochelle Nephew MVH:[id number]    PATIENT STRESSORS: Health problems Marital or family conflict Substance abuse Traumatic event   PATIENT STRENGTHS: Average or above average intelligence Communication skills Motivation for treatment/growth Physical Health Supportive family/friends Work skills   PATIENT IDENTIFIED PROBLEMS: overdose  anxiety  depression  grieving               DISCHARGE CRITERIA:  Ability to meet basic life and health needs Improved stabilization in mood, thinking, and/or behavior Motivation to continue treatment in a less acute level of care Need for constant or close observation no longer present  PRELIMINARY DISCHARGE PLAN: Attend aftercare/continuing care group Attend 12-step recovery group Return to previous living arrangement Return to previous work or school arrangements  PATIENT/FAMILY INVOLVEMENT: This treatment plan has been presented to and reviewed with the patient, Arthur Martinez.  The patient and family have been given the opportunity to ask questions and make suggestions.  Baron Sane, RN 03/03/2019, 5:06 PM

## 2019-03-04 DIAGNOSIS — F332 Major depressive disorder, recurrent severe without psychotic features: Secondary | ICD-10-CM

## 2019-03-04 LAB — LIPID PANEL
Cholesterol: 122 mg/dL (ref 0–200)
HDL: 46 mg/dL (ref >40)
LDL Cholesterol: 68 mg/dL (ref 0–99)
Total CHOL/HDL Ratio: 2.7 RATIO
Triglycerides: 41 mg/dL (ref <150)
VLDL: 8 mg/dL (ref 0–40)

## 2019-03-04 LAB — HEMOGLOBIN A1C
Hgb A1c MFr Bld: 5 % (ref 4.8–5.6)
Mean Plasma Glucose: 96.8 mg/dL

## 2019-03-04 LAB — VALPROIC ACID LEVEL: Valproic Acid Lvl: <10 ug/mL — ABNORMAL LOW (ref 50.0–100.0)

## 2019-03-04 LAB — TSH: TSH: 0.239 u[IU]/mL — ABNORMAL LOW (ref 0.350–4.500)

## 2019-03-04 MED ORDER — LORAZEPAM 1 MG PO TABS
1.0000 mg | ORAL_TABLET | Freq: Four times a day (QID) | ORAL | Status: DC | PRN
  Administered 2019-03-05: 21:00:00 1 mg via ORAL
  Filled 2019-03-04: qty 1

## 2019-03-04 MED ORDER — MIRTAZAPINE 15 MG PO TABS
15.0000 mg | ORAL_TABLET | Freq: Every day | ORAL | Status: DC
  Administered 2019-03-04 – 2019-03-06 (×3): 15 mg via ORAL
  Filled 2019-03-04 (×7): qty 1

## 2019-03-04 MED ORDER — THIAMINE HCL 100 MG PO TABS
100.0000 mg | ORAL_TABLET | Freq: Every day | ORAL | Status: DC
  Administered 2019-03-04 – 2019-03-07 (×4): 100 mg via ORAL
  Filled 2019-03-04 (×6): qty 1

## 2019-03-04 MED ORDER — FOLIC ACID 1 MG PO TABS
1.0000 mg | ORAL_TABLET | Freq: Every day | ORAL | Status: DC
  Administered 2019-03-04 – 2019-03-07 (×4): 1 mg via ORAL
  Filled 2019-03-04 (×6): qty 1

## 2019-03-04 NOTE — Progress Notes (Signed)
Pt attended AA group this evening.  

## 2019-03-04 NOTE — Tx Team (Signed)
Interdisciplinary Treatment and Diagnostic Plan Update  03/04/2019 Time of Session:  Arthur Martinez MRN: [id number]  Principal Diagnosis: <principal problem not specified>  Secondary Diagnoses: Active Problems:   MDD (major depressive disorder)   Current Medications:  Current Facility-Administered Medications  Medication Dose Route Frequency Provider Last Rate Last Admin  . dicyclomine (BENTYL) tablet 20 mg  20 mg Oral Q6H PRN Mordecai Maes, NP   20 mg at 03/03/19 1746  . hydrOXYzine (ATARAX/VISTARIL) tablet 25 mg  25 mg Oral Q6H PRN Mordecai Maes, NP   25 mg at 03/03/19 1746  . loperamide (IMODIUM) capsule 2-4 mg  2-4 mg Oral PRN Mordecai Maes, NP      . LORazepam (ATIVAN) tablet 1 mg  1 mg Oral Q6H PRN Sharma Covert, MD      . methocarbamol (ROBAXIN) tablet 500 mg  500 mg Oral Q8H PRN Mordecai Maes, NP   500 mg at 03/03/19 1746  . mirtazapine (REMERON) tablet 15 mg  15 mg Oral QHS Sharma Covert, MD      . naproxen (NAPROSYN) tablet 500 mg  500 mg Oral BID PRN Mordecai Maes, NP   500 mg at 03/03/19 1746  . nicotine (NICODERM CQ - dosed in mg/24 hours) patch 21 mg  21 mg Transdermal Daily Sharma Covert, MD   21 mg at 03/03/19 1747  . ondansetron (ZOFRAN-ODT) disintegrating tablet 4 mg  4 mg Oral Q6H PRN Mordecai Maes, NP      . pneumococcal 23 valent vaccine (PNEUMOVAX-23) injection 0.5 mL  0.5 mL Intramuscular Tomorrow-1000 Sharma Covert, MD      . traZODone (DESYREL) tablet 50 mg  50 mg Oral QHS PRN,MR X 1 Anike, Adaku C, NP   50 mg at 03/03/19 2142   PTA Medications: Medications Prior to Admission  Medication Sig Dispense Refill Last Dose  . divalproex (DEPAKOTE) 500 MG DR tablet Take 500 mg by mouth 2 (two) times daily.   Past Week at Unknown time  . gabapentin (NEURONTIN) 300 MG capsule Take 300 mg by mouth 2 (two) times daily.    Past Week at Unknown time  . Buprenorphine HCl-Naloxone HCl (SUBOXONE) 4-1 MG FILM Place 1 Film under the tongue  2 (two) times daily.     Marland Kitchen OLANZapine (ZYPREXA) 10 MG tablet Take 10 mg by mouth at bedtime.     . traZODone (DESYREL) 50 MG tablet Take 50 mg by mouth at bedtime.       Patient Stressors: Health problems Marital or family conflict Substance abuse Traumatic event  Patient Strengths: Average or above average intelligence Communication skills Motivation for treatment/growth Physical Health Supportive family/friends Work skills  Treatment Modalities: Medication Management, Group therapy, Case management,  1 to 1 session with clinician, Psychoeducation, Recreational therapy.   Physician Treatment Plan for Primary Diagnosis: <principal problem not specified> Long Term Goal(s):     Short Term Goals:    Medication Management: Evaluate patient's response, side effects, and tolerance of medication regimen.  Therapeutic Interventions: 1 to 1 sessions, Unit Group sessions and Medication administration.  Evaluation of Outcomes: Not Met  Physician Treatment Plan for Secondary Diagnosis: Active Problems:   MDD (major depressive disorder)  Long Term Goal(s):     Short Term Goals:       Medication Management: Evaluate patient's response, side effects, and tolerance of medication regimen.  Therapeutic Interventions: 1 to 1 sessions, Unit Group sessions and Medication administration.  Evaluation of Outcomes: Not Met   RN Treatment  Plan for Primary Diagnosis: <principal problem not specified> Long Term Goal(s): Knowledge of disease and therapeutic regimen to maintain health will improve  Short Term Goals: Ability to participate in decision making will improve, Ability to disclose and discuss suicidal ideas, Ability to identify and develop effective coping behaviors will improve and Compliance with prescribed medications will improve  Medication Management: RN will administer medications as ordered by provider, will assess and evaluate patient's response and provide education to  patient for prescribed medication. RN will report any adverse and/or side effects to prescribing provider.  Therapeutic Interventions: 1 on 1 counseling sessions, Psychoeducation, Medication administration, Evaluate responses to treatment, Monitor vital signs and CBGs as ordered, Perform/monitor CIWA, COWS, AIMS and Fall Risk screenings as ordered, Perform wound care treatments as ordered.  Evaluation of Outcomes: Not Met   LCSW Treatment Plan for Primary Diagnosis: <principal problem not specified> Long Term Goal(s): Safe transition to appropriate next level of care at discharge, Engage patient in therapeutic group addressing interpersonal concerns.  Short Term Goals: Engage patient in aftercare planning with referrals and resources  Therapeutic Interventions: Assess for all discharge needs, 1 to 1 time with Social worker, Explore available resources and support systems, Assess for adequacy in community support network, Educate family and significant other(s) on suicide prevention, Complete Psychosocial Assessment, Interpersonal group therapy.  Evaluation of Outcomes: Not Met   Progress in Treatment: Attending groups: No. New to unit Participating in groups: No. Taking medication as prescribed: Yes. Toleration medication: Yes. Family/Significant other contact made: No, will contact:  if patient consents  Patient understands diagnosis: Yes. Discussing patient identified problems/goals with staff: Yes. Medical problems stabilized or resolved: Yes. Denies suicidal/homicidal ideation: Yes. Issues/concerns per patient self-inventory: No. Other:   New problem(s) identified: None   New Short Term/Long Term Goal(s): Detox, medication stabilization, elimination of SI thoughts, development of comprehensive mental wellness plan.    Patient Goals:    Discharge Plan or Barriers: Patient recently admitted. CSW will continue to follow and assess for appropriate referrals and possible discharge  planning.    Reason for Continuation of Hospitalization: Anxiety Depression Medication stabilization Suicidal ideation  Estimated Length of Stay: 3-5 days   Attendees: Patient: 03/04/2019 9:36 AM  Physician: Dr. Myles Lipps, MD 03/04/2019 9:36 AM  Nursing: Will.Darnell Level RN 03/04/2019 9:36 AM  RN Care Manager: 03/04/2019 9:36 AM  Social Worker: Radonna Ricker, LCSW 03/04/2019 9:36 AM  Recreational Therapist:  03/04/2019 9:36 AM  Other:  03/04/2019 9:36 AM  Other:  03/04/2019 9:36 AM  Other: 03/04/2019 9:36 AM    Scribe for Treatment Team: Marylee Floras, [place] 03/04/2019 9:36 AM

## 2019-03-04 NOTE — H&P (Signed)
Psychiatric Admission Assessment Adult  Patient Identification: Arthur Martinez  MRN:  [id number]  Date of Evaluation:  03/04/2019  Chief Complaint: "I do not know, I took too many Xanax".   Principal Diagnosis: MDD (major depressive disorder)  Diagnosis:  Principal Problem:   MDD (major depressive disorder)  History of Present Illness: (Per Md's admission SRA): Patient is a 19 year old male who presented as a voluntary admission to the [facility] emergency department on 03/03/2019 after what he reported as "I do not know, I took too many Xanax".  The patient has a longstanding history of polysubstance dependence.  He has multiple emergency room visits secondary to substances, and his last admission to our facility was on 08/30/2018.  Prior to that he had been also admitted to the psychiatric hospital in April 2020.  He is not a very good historian, is lying in bed and does not appear to be very motivated or in significant withdrawal at this point.  Collateral information from his father detailed the fact that the patient had been grieving the loss of his younger brother who died earlier this year.  The patient also reports having multiple court dates coming up in January 2021 for assault.  He had been prescribed mirtazapine on discharge, but admitted he did not follow-up with psychiatric treatment or continue psychiatric medications.  The patient's father stated that the patient had a plan to kill himself on January 31 which is the anniversary of the death of his brother.  The patient also received Narcan last night because of a previous overdose on heroin and Xanax prior to that.  He was admitted to the hospital for evaluation   Associated Signs/Symptoms: Depression Symptoms:  depressed mood, psychomotor agitation, feelings of worthlessness/guilt, hopelessness, anxiety, loss of energy/fatigue, disturbed sleep,  (Hypo) Manic Symptoms:   Distractibility, Impulsivity, Irritable Mood,  Anxiety Symptoms:  Excessive Worry,  Psychotic Symptoms:  Denies any hallucinations, delusions or paranoia  PTSD Symptoms: Had a traumatic exposure: Brother was murdered in January or February 2020. Re-experiencing:  Intrusive Thoughts  Total Time spent with patient: 1 hour  Past Psychiatric History: Patient was last admitted to the psychiatric hospital in [place] of this year, 2020. He had been placed under involuntary commitment previously.  He had also been seen by psychiatry in the emergency department at [facility] on 07/22/2014 for suicidal ideation and irritability.  He has been previously diagnosed with bipolar disorder and has been previously prescribed Risperdal as well as Neurontin.  He has a history of polysubstance dependence as well.  Is the patient at risk to self? No.  Has the patient been a risk to self in the past 6 months? Yes.    Has the patient been a risk to self within the distant past? Yes.    Is the patient a risk to others? No.  Has the patient been a risk to others in the past 6 months? No.  Has the patient been a risk to others within the distant past? No.   Prior Inpatient Therapy: Yes, [facility] Prior Outpatient Therapy: Yes, (Not compliant).  Alcohol Screening: 1. How often do you have a drink containing alcohol?: 2 to 3 times a week 2. How many drinks containing alcohol do you have on a typical day when you are drinking?: 1 or 2 3. How often do you have six or more drinks on one occasion?: Never AUDIT-C Score: 3 4. How often during the  last year have you found that you were not able to stop drinking once you had started?: Never 5. How often during the last year have you failed to do what was normally expected from you becasue of drinking?: Never 6. How often during the last year have you needed a first drink in the morning to get yourself going after a heavy  drinking session?: Never 7. How often during the last year have you had a feeling of guilt of remorse after drinking?: Never 8. How often during the last year have you been unable to remember what happened the night before because you had been drinking?: Never 9. Have you or someone else been injured as a result of your drinking?: No 10. Has a relative or friend or a doctor or another health worker been concerned about your drinking or suggested you cut down?: No Alcohol Use Disorder Identification Test Final Score (AUDIT): 3  Substance Abuse History in the last 12 months:  Yes.    Consequences of Substance Abuse: Medical Consequences:  Liver damage, Possible death by overdose Legal Consequences:  Arrests, jail time, Loss of driving privilege. Family Consequences:  Family discord, divorce and or separation.  Previous Psychotropic Medications: Yes   Psychological Evaluations: Yes   Past Medical History:  Past Medical History:  Diagnosis Date  . Bipolar 1 disorder (HCC)   . Hypertension     Past Surgical History:  Procedure Laterality Date  . CIRCUMCISION     Family History:  Family History  Problem Relation Age of Onset  . Heart attack Maternal Grandfather   . Bipolar disorder Maternal Grandfather   . Schizophrenia Maternal Grandfather   . Stroke Paternal Grandfather   . Depression Mother   . Anxiety disorder Mother   . ADD / ADHD Brother   . Depression Maternal Grandmother   . Anxiety disorder Maternal Grandmother   . Migraines Other    Family Psychiatric  History: Bipolar disorder in a grandfather, schizophrenia and a grandfather, depression in mother, anxiety in mother, attention problems and brother.  Tobacco Screening: He does smoke cigarettes. Social History:  Social History   Substance and Sexual Activity  Alcohol Use Yes  . Alcohol/week: 2.0 standard drinks  . Types: 2 Cans of beer per week   Comment: occ     Social History   Substance and Sexual  Activity  Drug Use Yes  . Frequency: 7.0 times per week  . Types: Marijuana, Heroin    Additional Social History:  Allergies:   Allergies  Allergen Reactions  . Tylenol [Acetaminophen] Hives and Itching   Lab Results:  Results for orders placed or performed during the hospital encounter of 03/03/19 (from the past 48 hour(s))  Hemoglobin A1c     Status: None   Collection Time: 03/04/19  6:26 AM  Result Value Ref Range   Hgb A1c MFr Bld 5.0 4.8 - 5.6 %    Comment: (NOTE) Pre diabetes:          5.7%-6.4% Diabetes:              >6.4% Glycemic control for   <7.0% adults with diabetes    Mean Plasma Glucose 96.8 mg/dL    Comment: Performed at [facility], [address] [place], [place] [address]  Lipid panel     Status: None   Collection Time: 03/04/19  6:26 AM  Result Value Ref Range   Cholesterol 122 0 - 200 mg/dL   Triglycerides 41 <098<150 mg/dL  HDL 46 >40 mg/dL   Total CHOL/HDL Ratio 2.7 RATIO   VLDL 8 0 - 40 mg/dL   LDL Cholesterol 68 0 - 99 mg/dL    Comment:        Total Cholesterol/HDL:CHD Risk Coronary Heart Disease Risk Table                     Men   Women  1/2 Average Risk   3.4   3.3  Average Risk       5.0   4.4  2 X Average Risk   9.6   7.1  3 X Average Risk  23.4   11.0        Use the calculated Patient Ratio above and the CHD Risk Table to determine the patient's CHD Risk.        ATP III CLASSIFICATION (LDL):  <100     mg/dL   Optimal  100-129  mg/dL   Near or Above                    Optimal  130-159  mg/dL   Borderline  160-189  mg/dL   High  >190     mg/dL   Very High Performed at [facility] [address], [place], [place] [address]   TSH     Status: Abnormal   Collection Time: 03/04/19  6:26 AM  Result Value Ref Range   TSH 0.239 (L) 0.350 - 4.500 uIU/mL    Comment: Performed by a 3rd Generation assay with a functional sensitivity of <=0.01 uIU/mL. Performed at [facility], [place] [address], [place], [place] [address]   Valproic acid level     Status: Abnormal   Collection Time: 03/04/19  6:26 AM  Result Value Ref Range   Valproic Acid Lvl <10 (L) 50.0 - 100.0 ug/mL    Comment: RESULTS CONFIRMED BY MANUAL DILUTION Performed at [facility] [address] [place], [place] [address]    Blood Alcohol level:  Lab Results  Component Value Date   [facility] <10 03/02/2019   ETH <10 53/61/4431   Metabolic Disorder Labs:  Lab Results  Component Value Date   HGBA1C 5.0 03/04/2019   MPG 96.8 03/04/2019   No results found for: PROLACTIN Lab Results  Component Value Date   CHOL 122 03/04/2019   TRIG 41 03/04/2019   HDL 46 03/04/2019   CHOLHDL 2.7 03/04/2019   VLDL 8 03/04/2019   LDLCALC 68 03/04/2019   LDLCALC  10/04/2007    88        Total Cholesterol/HDL:CHD Risk Coronary Heart Disease Risk Table                     Men   Women  1/2 Average Risk   3.4   3.3   Current Medications: Current Facility-Administered Medications  Medication Dose Route Frequency Provider Last Rate Last Admin  . dicyclomine (BENTYL) tablet 20 mg  20 mg Oral Q6H PRN Mordecai Maes, NP   20 mg at 03/04/19 1019  . folic acid (FOLVITE) tablet 1 mg  1 mg Oral Daily Sharma Covert, MD   1 mg at 03/04/19 1304  . hydrOXYzine (ATARAX/VISTARIL) tablet 25 mg  25 mg Oral Q6H PRN Mordecai Maes, NP   25 mg at 03/04/19 1020  . loperamide (IMODIUM) capsule 2-4 mg  2-4 mg Oral PRN Mordecai Maes, NP      .  LORazepam (ATIVAN) tablet 1 mg  1 mg Oral Q6H PRN Antonieta Pert, MD      . methocarbamol (ROBAXIN) tablet 500 mg  500 mg Oral Q8H PRN Denzil Magnuson, NP   500 mg at 03/04/19 1019  . mirtazapine (REMERON) tablet 15 mg  15 mg Oral QHS Antonieta Pert, MD      . naproxen (NAPROSYN) tablet 500 mg  500 mg Oral BID PRN Denzil Magnuson, NP   500 mg at 03/03/19 1746  . nicotine (NICODERM CQ - dosed in mg/24 hours) patch 21 mg  21 mg Transdermal Daily Antonieta Pert, MD   21  mg at 03/03/19 1747  . ondansetron (ZOFRAN-ODT) disintegrating tablet 4 mg  4 mg Oral Q6H PRN Denzil Magnuson, NP      . pneumococcal 23 valent vaccine (PNEUMOVAX-23) injection 0.5 mL  0.5 mL Intramuscular Tomorrow-1000 Antonieta Pert, MD      . thiamine tablet 100 mg  100 mg Oral Daily Antonieta Pert, MD   100 mg at 03/04/19 1304  . traZODone (DESYREL) tablet 50 mg  50 mg Oral QHS PRN,MR X 1 Anike, Adaku C, NP   50 mg at 03/03/19 2142   PTA Medications: Medications Prior to Admission  Medication Sig Dispense Refill Last Dose  . divalproex (DEPAKOTE) 500 MG DR tablet Take 500 mg by mouth 2 (two) times daily.   Past Week at Unknown time  . gabapentin (NEURONTIN) 300 MG capsule Take 300 mg by mouth 2 (two) times daily.    Past Week at Unknown time  . Buprenorphine HCl-Naloxone HCl (SUBOXONE) 4-1 MG FILM Place 1 Film under the tongue 2 (two) times daily.     Marland Kitchen OLANZapine (ZYPREXA) 10 MG tablet Take 10 mg by mouth at bedtime.     . traZODone (DESYREL) 50 MG tablet Take 50 mg by mouth at bedtime.      Musculoskeletal: Strength & Muscle Tone: within normal limits Gait & Station: normal Patient leans: N/A  Psychiatric Specialty Exam: Physical Exam  Nursing note and vitals reviewed. Constitutional: He is oriented to person, place, and time. He appears well-developed and well-nourished.  HENT:  Head: Normocephalic and atraumatic.  Cardiovascular: Normal rate.  Respiratory: Effort normal.  Genitourinary:    Genitourinary Comments: Deferred   Musculoskeletal:        General: Normal range of motion.     Cervical back: Normal range of motion.  Neurological: He is alert and oriented to person, place, and time.  Skin: Skin is warm and dry.    Review of Systems  Constitutional: Negative for chills and fever.  HENT: Negative for congestion and sore throat.   Respiratory: Negative for cough, shortness of breath and wheezing.   Cardiovascular: Negative for chest pain and palpitations.   Gastrointestinal: Negative for diarrhea, nausea and vomiting.  Musculoskeletal: Negative for myalgias.  Neurological: Negative for dizziness and headaches.  Psychiatric/Behavioral: Positive for depression and substance abuse. Negative for hallucinations, memory loss and suicidal ideas. The patient is nervous/anxious and has insomnia.     Blood pressure 131/84, pulse 73, temperature 98 F (36.7 C), temperature source Oral, resp. rate 16, height 5\' 7"  (1.702 m), weight 76.7 kg, SpO2 100 %.Body mass index is 26.47 kg/m.  General Appearance: Disheveled  Eye Contact:  Minimal  Speech:  Normal Rate  Volume:  Decreased  Mood:  Sedated  Affect:  Congruent  Thought Process:  Coherent and Descriptions of Associations: Circumstantial  Orientation:  Full (Time, Place, and  Person)  Thought Content:  Logical  Suicidal Thoughts:  Denies  Homicidal Thoughts:  Denies  Memory:  Immediate;   Poor Recent;   Poor Remote;   Poor  Judgement:  Impaired  Insight:  Lacking  Psychomotor Activity:  Normal  Concentration:  Concentration: Fair and Attention Span: Fair  Recall:  [employer] of Knowledge:  Fair  Language:  Fair  Akathisia:  Negative  Handed:  Right  AIMS (if indicated):     Assets:  Desire for Improvement Resilience  ADL's:  Intact  Cognition:  WNL  Sleep:  Number of Hours: 6.25   Treatment Plan Summary: Daily contact with patient to assess and evaluate symptoms and progress in treatment and Medication management.  Treatment Plan/Recommendations:  1. Admit for crisis management and stabilization, estimated length of stay 3-5 days.    2. Medication management to reduce current symptoms to base line and improve the patient's overall level of functioning: See MAR, Md's SRA & treatment plan.   Observation Level/Precautions:  Detox 15 minute checks  Laboratory:  Per ED, UDS (+) for Benzodiazepine, opioid & THC  Psychotherapy: Group sessions  Medications: See MAR.  Consultations: As  needed.   Discharge Concerns: Mood stability & maintaining stability.   Estimated LOS: 2-4 days  Other: Admit to the [facility].   Physician Treatment Plan for Primary Diagnosis: MDD (major depressive disorder)  Long Term Goal(s): Improvement in symptoms so as ready for discharge  Short Term Goals: Ability to identify changes in lifestyle to reduce recurrence of condition will improve, Ability to verbalize feelings will improve and Ability to disclose and discuss suicidal ideas  Physician Treatment Plan for Secondary Diagnosis: Principal Problem:   MDD (major depressive disorder)  Long Term Goal(s): Improvement in symptoms so as ready for discharge  Short Term Goals: Ability to identify changes in lifestyle to reduce recurrence of condition will improve, Ability to verbalize feelings will improve, Ability to disclose and discuss suicidal ideas, Ability to demonstrate self-control will improve, Ability to maintain clinical measurements within normal limits will improve and Ability to identify triggers associated with substance abuse/mental health issues will improve  I certify that inpatient services furnished can reasonably be expected to improve the patient's condition.    Armandina Stammer, NP, PMHNP, FNP-BC 1/1/20211:09 PM

## 2019-03-04 NOTE — Progress Notes (Signed)
   03/04/19 2200  Psych Admission Type (Psych Patients Only)  Admission Status Voluntary  Psychosocial Assessment  Patient Complaints None  Eye Contact Brief  Facial Expression Flat  Affect Appropriate to circumstance  Speech Logical/coherent  Interaction Minimal  Motor Activity Fidgety  Appearance/Hygiene In scrubs  Behavior Characteristics Appropriate to situation;Cooperative  Mood Depressed  Thought Process  Coherency WDL  Content WDL  Delusions None reported or observed  Perception WDL  Hallucination None reported or observed  Judgment Impaired  Confusion None  Danger to Self  Current suicidal ideation? Denies  Danger to Others  Danger to Others None reported or observed  Pt. States that he is doing "ok", he attended evening group and was medicated as ordered.  Pt. Denies any new complaints/needs and safety checks are maintained.

## 2019-03-04 NOTE — [facility] Suicide Risk Assessment (Signed)
[facility] Admission Suicide Risk Assessment   Nursing information obtained from:  Patient Demographic factors:  Male, Adolescent or young adult Current Mental Status:  Suicidal ideation indicated by patient, Plan includes specific time, place, or method, Intention to act on suicide plan, Self-harm thoughts, Belief that plan would result in death, Suicide plan Loss Factors:  Loss of significant relationship, Financial problems / change in socioeconomic status, Decline in physical health Historical Factors:  Impulsivity Risk Reduction Factors:  Sense of responsibility to family, Employed, Positive social support, Living with another person, especially a relative, Positive therapeutic relationship  Total Time spent with patient: 20 minutes Principal Problem: <principal problem not specified> Diagnosis:  Active Problems:   MDD (major depressive disorder)  Subjective Data: Patient is seen and examined.  Patient is a 19 year old male who presented as a voluntary admission to the [facility] emergency department on 03/03/2019 after what he reported as "I do not know, I took too many Xanax".  The patient has a longstanding history of polysubstance dependence.  He has multiple emergency room visits secondary to substances, and his last admission to our facility was on 08/30/2018.  Prior to that he had been also admitted to the psychiatric hospital in April 2020.  He is not a very good historian, is lying in bed and does not appear to be very motivated or in significant withdrawal at this point.  Collateral information from his father detailed the fact that the patient had been grieving the loss of his younger brother who died earlier this year.  The patient also reports having multiple court dates coming up in January 2021 for assault.  He had been prescribed mirtazapine on discharge, but admitted he did not follow-up with psychiatric treatment or continue psychiatric medications.  The patient's  father stated that the patient had a plan to kill himself on January 31 which is the anniversary of the death of his brother.  The patient also received Narcan last night because of a previous overdose on heroin and Xanax prior to that.  He was admitted to the hospital for evaluation and stabilization.  Continued Clinical Symptoms:  Alcohol Use Disorder Identification Test Final Score (AUDIT): 3 The "Alcohol Use Disorders Identification Test", Guidelines for Use in Primary Care, Second Edition.  World Science writer [facility]). Score between 0-7:  no or low risk or alcohol related problems. Score between 8-15:  moderate risk of alcohol related problems. Score between 16-19:  high risk of alcohol related problems. Score 20 or above:  warrants further diagnostic evaluation for alcohol dependence and treatment.   CLINICAL FACTORS:   Depression:   Anhedonia Comorbid alcohol abuse/dependence Impulsivity Insomnia Alcohol/Substance Abuse/Dependencies   Musculoskeletal: Strength & Muscle Tone: within normal limits Gait & Station: normal Patient leans: N/A  Psychiatric Specialty Exam: Physical Exam  Nursing note and vitals reviewed. Constitutional: He is oriented to person, place, and time. He appears well-developed and well-nourished.  HENT:  Head: Normocephalic and atraumatic.  Respiratory: Effort normal.  Neurological: He is alert and oriented to person, place, and time.    Review of Systems  Blood pressure 131/84, pulse 73, temperature 98 F (36.7 C), temperature source Oral, resp. rate 16, height 5\' 7"  (1.702 m), weight 76.7 kg, SpO2 100 %.Body mass index is 26.47 kg/m.  General Appearance: Disheveled  Eye Contact:  Minimal  Speech:  Normal Rate  Volume:  Decreased  Mood:  Sedated  Affect:  Congruent  Thought Process:  Coherent and Descriptions of Associations: Circumstantial  Orientation:  Full (Time, Place, and Person)  Thought Content:  Logical  Suicidal Thoughts:  No   Homicidal Thoughts:  No  Memory:  Immediate;   Poor Recent;   Poor Remote;   Poor  Judgement:  Impaired  Insight:  Lacking  Psychomotor Activity:  Normal  Concentration:  Concentration: Fair and Attention Span: Fair  Recall:  [employer] of Knowledge:  Fair  Language:  Fair  Akathisia:  Negative  Handed:  Right  AIMS (if indicated):     Assets:  Desire for Improvement Resilience  ADL's:  Intact  Cognition:  WNL  Sleep:  Number of Hours: 6.25      COGNITIVE FEATURES THAT CONTRIBUTE TO RISK:  None    SUICIDE RISK:   Mild:  Suicidal ideation of limited frequency, intensity, duration, and specificity.  There are no identifiable plans, no associated intent, mild dysphoria and related symptoms, good self-control (both objective and subjective assessment), few other risk factors, and identifiable protective factors, including available and accessible social support.  PLAN OF CARE: Patient is seen and examined.  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  He will be admitted to the hospital.  He will be integrated into the milieu.  He will be encouraged to attend groups.  His mirtazapine will be restarted from his last hospitalization.  He will also be placed on lorazepam 1 mg p.o. every 6 hours as needed withdrawal symptoms from Xanax, and as well we will put him on the opiate detox protocol but I will hold his clonidine.  Review of his laboratories revealed elevated creatinine at 1.4.  This was normal 1 month ago, and is most likely related to substance issues.  Liver function enzymes were normal.  CBC was essentially normal.  He had a Depakote level done on 1/1 that was less than 10.  His TSH is slightly low at 0.239.  Drug screen was positive for benzodiazepines, opiates and marijuana.  EKG showed a normal sinus rhythm with normal QTc interval.  He will discuss with social work the  possibility of residential substance abuse treatment, but much of that depends on his own motivation.  I certify that inpatient services furnished can reasonably be expected to improve the patient's condition.   Sharma Covert, MD 03/04/2019, 10:27 AM

## 2019-03-04 NOTE — Progress Notes (Signed)
   03/04/19 1500  Psych Admission Type (Psych Patients Only)  Admission Status Voluntary  Psychosocial Assessment  Patient Complaints Nervousness;Shakiness  Eye Contact Brief  Facial Expression Flat  Affect Appropriate to circumstance  Speech Logical/coherent  Interaction Minimal  Motor Activity Fidgety  Appearance/Hygiene In scrubs  Behavior Characteristics Appropriate to situation;Cooperative  Mood Anxious  Thought Process  Coherency WDL  Content WDL  Delusions None reported or observed  Perception WDL  Hallucination None reported or observed  Judgment Poor  Confusion None  Danger to Self  Current suicidal ideation? Denies  Danger to Others  Danger to Others None reported or observed

## 2019-03-05 MED ORDER — GABAPENTIN 300 MG PO CAPS
300.0000 mg | ORAL_CAPSULE | Freq: Three times a day (TID) | ORAL | Status: DC
  Administered 2019-03-05 – 2019-03-07 (×6): 300 mg via ORAL
  Filled 2019-03-05 (×10): qty 1

## 2019-03-05 NOTE — Progress Notes (Signed)
D. Pt presents with an anxious affect/ depressed mood- calm and cooperative, somewhat guarded behavior.  Pt currently denies SI/HI and AVH  A. Labs and vitals monitored. Pt compliant with medications. Pt supported emotionally and encouraged to express concerns and ask questions.   R. Pt remains safe with 15 minute checks. Will continue POC.

## 2019-03-05 NOTE — Progress Notes (Signed)
[facility] MD Progress Note  03/05/2019 1:37 PM Arthur Martinez  MRN:  [id number]  Subjective: Arthur Martinez reports, "I'm not okay. I'm feeling bad from the opioid withdrawal symptoms. My body aches, my anxiety is high. I did not sleep at all last night. I'm not depressed".  Objective: Patient is a 19 year old male who presented as a voluntary admission to the [facility] emergency department on 03/03/2019 after what he reported as "I do not know, I took too many Xanax".  The patient has a longstanding history of polysubstance dependence.  He has multiple emergency room visits secondary to substances, and his last admission to our facility was on 08/30/2018. Collateral information from his father detailed the fact that the patient had been grieving the loss of his younger brother who died earlier this year.  The patient also reports having multiple court dates coming up in January 2021 for assault.  Arthur Martinez is seen, chart reviewed. The chart findings discussed with the treatment team. He presents alert, oriented x 4. He is lying down in his bed. He is verbally responsive making a fair eye contact. He says he is having bad opioid withdrawal symptoms. He says he is having high levels of anxiety. He adds that he did not sleep well last night, however, documentation showed he slept for about 6.75 hours last night. To help with the agitation/high anxiety levels, he is started on gabapentin 300 mg po tid x 3 days. He denies any symptoms of anxiety. He denies any SIHI, AVH, delusional thoughts or paranoia. He does not appear to be responding to any internal stimuli. Arthur Martinez is in agreement to continue his current plan of care as already in progress.  Principal Problem: MDD (major depressive disorder)  Diagnosis: Principal Problem:   MDD (major depressive disorder)  Total Time spent with patient: 25 minutes  Past Psychiatric History: See H&P  Past Medical History:  Past Medical History:  Diagnosis Date   . Bipolar 1 disorder (HCC)   . Hypertension     Past Surgical History:  Procedure Laterality Date  . CIRCUMCISION     Family History:  Family History  Problem Relation Age of Onset  . Heart attack Maternal Grandfather   . Bipolar disorder Maternal Grandfather   . Schizophrenia Maternal Grandfather   . Stroke Paternal Grandfather   . Depression Mother   . Anxiety disorder Mother   . ADD / ADHD Brother   . Depression Maternal Grandmother   . Anxiety disorder Maternal Grandmother   . Migraines Other    Family Psychiatric  History: See H&P.  Social History:  Social History   Substance and Sexual Activity  Alcohol Use Yes  . Alcohol/week: 2.0 standard drinks  . Types: 2 Cans of beer per week   Comment: occ     Social History   Substance and Sexual Activity  Drug Use Yes  . Frequency: 7.0 times per week  . Types: Marijuana, Heroin    Social History   Socioeconomic History  . Marital status: Single    Spouse name: Not on file  . Number of children: Not on file  . Years of education: Not on file  . Highest education level: Not on file  Occupational History  . Not on file  Tobacco Use  . Smoking status: Current Every Day Smoker    Packs/day: 1.00    Years: 7.00    Pack years: 7.00    Types: Cigarettes  . Smokeless tobacco: Never Used  .  Tobacco comment: Tried smoking and dipping once  Substance and Sexual Activity  . Alcohol use: Yes    Alcohol/week: 2.0 standard drinks    Types: 2 Cans of beer per week    Comment: occ  . Drug use: Yes    Frequency: 7.0 times per week    Types: Marijuana, Heroin  . Sexual activity: Yes    Birth control/protection: Condom  Other Topics Concern  . Not on file  Social History Narrative  . Not on file   Social Determinants of Health   Financial Resource Strain:   . Difficulty of Paying Living Expenses: Not on file  Food Insecurity:   . Worried About Programme researcher, broadcasting/film/video in the Last Year: Not on file  . Ran Out of Food  in the Last Year: Not on file  Transportation Needs:   . Lack of Transportation (Medical): Not on file  . Lack of Transportation (Non-Medical): Not on file  Physical Activity:   . Days of Exercise per Week: Not on file  . Minutes of Exercise per Session: Not on file  Stress:   . Feeling of Stress : Not on file  Social Connections:   . Frequency of Communication with Friends and Family: Not on file  . Frequency of Social Gatherings with Friends and Family: Not on file  . Attends Religious Services: Not on file  . Active Member of Clubs or Organizations: Not on file  . Attends Banker Meetings: Not on file  . Marital Status: Not on file   Additional Social History:   Sleep: Good, 6.75 per documentation. However, patient says he did not sleep well last night.  Appetite:  Fair  Current Medications: Current Facility-Administered Medications  Medication Dose Route Frequency Provider Last Rate Last Admin  . dicyclomine (BENTYL) tablet 20 mg  20 mg Oral Q6H PRN Denzil Magnuson, NP   20 mg at 03/04/19 1736  . folic acid (FOLVITE) tablet 1 mg  1 mg Oral Daily Antonieta Pert, MD   1 mg at 03/05/19 1601  . hydrOXYzine (ATARAX/VISTARIL) tablet 25 mg  25 mg Oral Q6H PRN Denzil Magnuson, NP   25 mg at 03/05/19 0932  . loperamide (IMODIUM) capsule 2-4 mg  2-4 mg Oral PRN Denzil Magnuson, NP      . LORazepam (ATIVAN) tablet 1 mg  1 mg Oral Q6H PRN Antonieta Pert, MD      . methocarbamol (ROBAXIN) tablet 500 mg  500 mg Oral Q8H PRN Denzil Magnuson, NP   500 mg at 03/05/19 3557  . mirtazapine (REMERON) tablet 15 mg  15 mg Oral QHS Antonieta Pert, MD   15 mg at 03/04/19 2134  . naproxen (NAPROSYN) tablet 500 mg  500 mg Oral BID PRN Denzil Magnuson, NP   500 mg at 03/03/19 1746  . nicotine (NICODERM CQ - dosed in mg/24 hours) patch 21 mg  21 mg Transdermal Daily Antonieta Pert, MD   21 mg at 03/03/19 1747  . ondansetron (ZOFRAN-ODT) disintegrating tablet 4 mg  4 mg  Oral Q6H PRN Denzil Magnuson, NP      . pneumococcal 23 valent vaccine (PNEUMOVAX-23) injection 0.5 mL  0.5 mL Intramuscular Tomorrow-1000 Antonieta Pert, MD      . thiamine tablet 100 mg  100 mg Oral Daily Antonieta Pert, MD   100 mg at 03/05/19 3220  . traZODone (DESYREL) tablet 50 mg  50 mg Oral QHS PRN,MR X 1 Anike, Adaku C,  NP   50 mg at 03/04/19 2135   Lab Results:  Results for orders placed or performed during the hospital encounter of 03/03/19 (from the past 48 hour(s))  Hemoglobin A1c     Status: None   Collection Time: 03/04/19  6:26 AM  Result Value Ref Range   Hgb A1c MFr Bld 5.0 4.8 - 5.6 %    Comment: (NOTE) Pre diabetes:          5.7%-6.4% Diabetes:              >6.4% Glycemic control for   <7.0% adults with diabetes    Mean Plasma Glucose 96.8 mg/dL    Comment: Performed at [facility], [place] [address] [place], [place] [address]  Lipid panel     Status: None   Collection Time: 03/04/19  6:26 AM  Result Value Ref Range   Cholesterol 122 0 - 200 mg/dL   Triglycerides 41 <150 mg/dL   HDL 46 >40 mg/dL   Total CHOL/HDL Ratio 2.7 RATIO   VLDL 8 0 - 40 mg/dL   LDL Cholesterol 68 0 - 99 mg/dL    Comment:        Total Cholesterol/HDL:CHD Risk Coronary Heart Disease Risk Table                     Men   Women  1/2 Average Risk   3.4   3.3  Average Risk       5.0   4.4  2 X Average Risk   9.6   7.1  3 X Average Risk  23.4   11.0        Use the calculated Patient Ratio above and the CHD Risk Table to determine the patient's CHD Risk.        ATP III CLASSIFICATION (LDL):  <100     mg/dL   Optimal  100-129  mg/dL   Near or Above                    Optimal  130-159  mg/dL   Borderline  160-189  mg/dL   High  >190     mg/dL   Very High Performed at [facility] [address] [place], [place] [address]   TSH     Status: Abnormal   Collection Time: 03/04/19  6:26 AM  Result Value Ref Range   TSH 0.239 (L) 0.350 - 4.500 uIU/mL     Comment: Performed by a 3rd Generation assay with a functional sensitivity of <=0.01 uIU/mL. Performed at [facility], [place] [address], [place], [place] [address]   Valproic acid level     Status: Abnormal   Collection Time: 03/04/19  6:26 AM  Result Value Ref Range   Valproic Acid Lvl <10 (L) 50.0 - 100.0 ug/mL    Comment: RESULTS CONFIRMED BY MANUAL DILUTION Performed at [facility] [address] [place],  [address]    Blood Alcohol level:  Lab Results  Component Value Date   [facility] <10 03/02/2019   ETH <10 78/46/9629   Metabolic Disorder Labs: Lab Results  Component Value Date   HGBA1C 5.0 03/04/2019   MPG 96.8 03/04/2019   No results found for: PROLACTIN Lab Results  Component Value Date   CHOL 122 03/04/2019   TRIG 41 03/04/2019   HDL 46 03/04/2019   CHOLHDL 2.7 03/04/2019   VLDL 8 03/04/2019   LDLCALC 68 03/04/2019  LDLCALC  10/04/2007    88        Total Cholesterol/HDL:CHD Risk Coronary Heart Disease Risk Table                     Men   Women  1/2 Average Risk   3.4   3.3   Physical Findings: AIMS: Facial and Oral Movements Muscles of Facial Expression: None, normal Lips and Perioral Area: None, normal Jaw: None, normal Tongue: None, normal,Extremity Movements Upper (arms, wrists, hands, fingers): None, normal Lower (legs, knees, ankles, toes): None, normal, Trunk Movements Neck, shoulders, hips: None, normal, Overall Severity Severity of abnormal movements (highest score from questions above): None, normal Incapacitation due to abnormal movements: None, normal Patient's awareness of abnormal movements (rate only patient's report): No Awareness, Dental Status Current problems with teeth and/or dentures?: No Does patient usually wear dentures?: No  CIWA:    COWS:  COWS Total Score: 2  Musculoskeletal: Strength & Muscle Tone: within normal limits Gait & Station: normal Patient leans: N/A  Psychiatric  Specialty Exam: Physical Exam  Nursing note and vitals reviewed. Constitutional: He is oriented to person, place, and time. He appears well-developed.  Cardiovascular: Normal rate.  Respiratory: Effort normal.  Genitourinary:    Genitourinary Comments: Deferred   Musculoskeletal:        General: Normal range of motion.     Cervical back: Normal range of motion.  Neurological: He is alert and oriented to person, place, and time.  Skin: Skin is warm and dry.    Review of Systems  Constitutional: Positive for chills and diaphoresis. Negative for fever.  HENT: Negative for congestion, rhinorrhea and sore throat.   Respiratory: Negative for cough, shortness of breath and wheezing.   Cardiovascular: Negative for chest pain and palpitations.  Gastrointestinal: Negative for diarrhea, nausea and vomiting.  Musculoskeletal: Positive for arthralgias (Opioid withdrawal symptoms) and myalgias (Opioid withdrawal symptoms).  Skin: Negative for color change.  Neurological: Negative for dizziness and headaches.  Psychiatric/Behavioral: Positive for agitation and sleep disturbance. Negative for behavioral problems, confusion, decreased concentration, dysphoric mood, hallucinations, self-injury and suicidal ideas. The patient is nervous/anxious. The patient is not hyperactive.     Blood pressure 140/78, pulse 75, temperature 98.3 F (36.8 C), temperature source Oral, resp. rate 16, height 5\' 7"  (1.702 m), weight 76.7 kg, SpO2 100 %.Body mass index is 26.47 kg/m.  General Appearance: Disheveled  Eye Contact:  Minimal  Speech:  Normal Rate  Volume:  Decreased  Mood:  Sedated  Affect:  Congruent  Thought Process:  Coherent and Descriptions of Associations: Circumstantial  Orientation:  Full (Time, Place, and Person)  Thought Content:  Logical  Suicidal Thoughts:  No  Homicidal Thoughts:  No  Memory:  Immediate;   Poor Recent;   Poor Remote;   Poor  Judgement:  Impaired  Insight:  Lacking   Psychomotor Activity:  Normal  Concentration:  Concentration: Fair and Attention Span: Fair  Recall:  [employer]  Fund of Knowledge:  Fair  Language:  Fair  Akathisia:  Negative  Handed:  Right  AIMS (if indicated):     Assets:  Desire for Improvement Resilience  ADL's:  Intact  Cognition:  WNL    Sleep:  Number of Hours: 6.75   Treatment Plan Summary: Daily contact with patient to assess and evaluate symptoms and progress in treatment and Medication management.  -Continue inpatient hospitalization.  -Will continue today 03/05/2019 plan as below except where it is noted.  -Depression   -  Continue Mirtazapine 15 mg po qhs for insomnia as well.  -Anxiety  -Continue atarax 50mg  po q6h prn anxiety  -Insomnia  -Continue Continue Trazodone 50 mg po qhs prn.  -Agitation    -Initiated Gabapentin 300 mg po tid x 3 days.  -Opioid/Benzodiazepine withdrawal symptoms.              -Continue Lorazepam 1 mg po Q 6 hrs prn for CIWA > 10.              -Methocarbarmol 500 mg po Q 8 hrs prn for muscle spasm.              -Continue Bentyl 20 mg po Q 6 hrs prn for abd spasms.              -Continue Folic acid 1 mg po daily for folate deficiency.              -Continue Naprosyn 500 mg po bid prn for pain.              -Continue Zofran-ODT 4 mg po Q 6 hrs prn for N/V.              -Continue Thiamine 100 mg po daily for thiamine deficiency.  -Encourage participation in groups and therapeutic milieu  -Disposition planning will be ongoing  , NP, PMHNP, FNP-BC 03/05/2019, 1:37 PM

## 2019-03-05 NOTE — Progress Notes (Signed)
   03/05/19 2241  Psych Admission Type (Psych Patients Only)  Admission Status Voluntary  Psychosocial Assessment  Patient Complaints Substance abuse  Eye Contact Brief  Facial Expression Flat  Affect Appropriate to circumstance  Speech Logical/coherent  Interaction Minimal  Motor Activity Fidgety  Appearance/Hygiene In scrubs  Behavior Characteristics Cooperative  Mood Anxious  Thought Process  Coherency WDL  Content WDL  Delusions None reported or observed  Perception WDL  Hallucination None reported or observed  Judgment Impaired  Confusion None  Danger to Self  Current suicidal ideation? Denies  Danger to Others  Danger to Others None reported or observed  D: Patient in  dayroom reports feeling anxious and generalized pain.  A: Medications administered as prescribed. Support and encouragement provided as needed.  R: Patient remains safe on the unit. Will continue to monitor for safety and stability.

## 2019-03-05 NOTE — Progress Notes (Signed)
[place] NOVEL CORONAVIRUS (COVID-19) DAILY CHECK-OFF SYMPTOMS - answer yes or no to each - every day NO YES  Have you had a fever in the past 24 hours?  . Fever (Temp > 37.80C / 100F) X   Have you had any of these symptoms in the past 24 hours? . New Cough .  Sore Throat  .  Shortness of Breath .  Difficulty Breathing .  Unexplained Body Aches   X   Have you had any one of these symptoms in the past 24 hours not related to allergies?   . Runny Nose .  Nasal Congestion .  Sneezing   X   If you have had runny nose, nasal congestion, sneezing in the past 24 hours, has it worsened?  X   EXPOSURES - check yes or no X   Have you traveled outside the state in the past 14 days?  X   Have you been in contact with someone with a confirmed diagnosis of COVID-19 or PUI in the past 14 days without wearing appropriate PPE?  X   Have you been living in the same home as a person with confirmed diagnosis of COVID-19 or a PUI (household contact)?    X   Have you been diagnosed with COVID-19?    X              What to do next: Answered NO to all: Answered YES to anything:   Proceed with unit schedule Follow the [facility] Inpatient Flowsheet.   
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
## 2019-03-06 NOTE — Progress Notes (Addendum)
Pt c/o poor sleep last night due to racing thoughts and withdrawal symptoms. Per night shift nurse, the pt did not sleep after receiving several prn medications. This morning the pt c/o  muscle spasms, stomach cramps, anxiety, restlessness, racing thoughts, and irritability. Prn medications administered as ordered per MD.     03/06/19 0900  Psych Admission Type (Psych Patients Only)  Admission Status Voluntary  Psychosocial Assessment  Patient Complaints Anxiety;Substance abuse  Eye Contact Brief  Facial Expression Flat  Affect Appropriate to circumstance  Speech Logical/coherent  Interaction Minimal  Motor Activity Fidgety  Appearance/Hygiene In scrubs  Behavior Characteristics Anxious;Irritable  Mood Anxious;Irritable  Aggressive Behavior  Effect No apparent injury  Thought Process  Coherency Concrete thinking  Content WDL  Delusions None reported or observed  Perception WDL  Hallucination None reported or observed  Judgment Limited  Confusion None  Danger to Self  Current suicidal ideation? Denies  Danger to Others  Danger to Others None reported or observed

## 2019-03-06 NOTE — Progress Notes (Signed)
Adult Psychoeducational Group Note  Date:  03/06/2019 Time:  2:17 AM  Group Topic/Focus:  Wrap-Up Group:   The focus of this group is to help patients review their daily goal of treatment and discuss progress on daily workbooks.  Participation Level:  Active  Participation Quality:  Appropriate  Affect:  Appropriate  Cognitive:  Appropriate  Insight: Appropriate  Engagement in Group:  Developing/Improving  Modes of Intervention:  Discussion  Additional Comments: Pt stated his goal for today was to focus on his treatment plan. Pt stated he accomplished his goal today. Pt stated his relationship with his family has improved since he was admitted. Pt stated been able to contact his aunt today improved his day. Pt stated he felt better about himself today. Pt rated his overall day an 7 out of 10. Pt stated his appetite was pretty good today. Pt stated his sleep last night was poor. Pt nurse was made aware of the situation. Pt stated he was in no physical pain today.  Pt deny auditory or visual hallucinations. Pt denies thoughts of harming himself or others. Pt stated he would alert staff if anything changes.   Felipa Furnace 03/06/2019, 2:17 AM

## 2019-03-06 NOTE — B[facility] Counselor (Signed)
Clinical Social Work Note  CSW received phone call from patient's father Jonh Mcqueary [phone] asking if it is true that patient is to be discharged tomorrow, because he feels strongly that patient needs to go to long-term rehab.  He stated he has just lost a son and is in grief over that, cannot lose another son.  He stated that the patient's mother knows of a facility in [place] and would like [place] to check that one for possible admission.  Although patient can go stay with family in between the hospital and any potential facility, the family is concerned that he will in that period of time return to doing drugs, possibly overdose and they will come home to find him "passed out on the floor again."  CSW asked father and stepmother who was also on the phone to call weekday CSW to give the name and information for the facility in [place].  Ambrose Mantle, LCSW 03/06/2019, 4:19 PM
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
## 2019-03-06 NOTE — Progress Notes (Signed)
Pt's mother and father called today asking to speak to the Child psychotherapist. They would like to speak to someone before the pt is released. They would like for the pt to go to a long term facility.   Mother: Harriett Sine [phone] Father: Dayle Mcnerney [phone]

## 2019-03-06 NOTE — BHH Group Notes (Signed)
[facility] LCSW Group Therapy Note  Date/Time:  03/06/2019   Type of Therapy and Topic:  Group Therapy:  Healthy and Unhealthy Supports  Participation Level:  Active   Description of Group:  Patients in this group were introduced to the idea of adding a variety of healthy supports to address the various needs in their lives.Patients discussed what additional healthy supports could be helpful in their recovery and wellness after discharge in order to prevent future hospitalizations.   An emphasis was placed on using counselor, doctor, therapy groups, 12-step groups, and problem-specific support groups to expand supports.    Therapeutic Goals:   1)  discuss importance of adding supports to stay well once out of the hospital  2)  compare healthy versus unhealthy supports and identify some examples of each  3)  generate ideas and descriptions of healthy supports that can be added  4)  offer mutual support about how to address unhealthy supports  5)  encourage active participation in and adherence to discharge plan    Summary of Patient Progress:  The patient stated that current healthy supports in his life are his aunt who cares for him and checks on him consistently while current unhealthy supports include none that he could think of.  The patient expressed a willingness to add grief counseling as support(s) to help in his recovery journey.   Therapeutic Modalities:   Motivational Interviewing Brief Solution-Focused Therapy  Ambrose Mantle, LCSW

## 2019-03-07 LAB — PROLACTIN: Prolactin: 12.4 ng/mL (ref 4.0–15.2)

## 2019-03-07 MED ORDER — GABAPENTIN 300 MG PO CAPS: 300.0000 mg | ORAL_CAPSULE | Freq: Three times a day (TID) | ORAL | 1 refills | Status: DC

## 2019-03-07 MED ORDER — MIRTAZAPINE 15 MG PO TABS: 15.0000 mg | ORAL_TABLET | Freq: Every day | ORAL | 1 refills | Status: DC

## 2019-03-07 NOTE — BHH Counselor (Signed)
CSW faxed a referral for residential substance use treatment to [facility] in [place]. It was explained to patient that he will need to call and follow up with his referrals for residential substance use treatment, as he is discharging today.  [facility] Authorization # [id number] Valid: 03/07/19 to 03/13/19  Enid Cutter, MSW, LCSW-A Clinical Social Worker [facility]  [phone]

## 2019-03-07 NOTE — Discharge Summary (Signed)
Physician Discharge Summary Note  Patient:  Arthur Martinez is an 19 y.o., male MRN:  [id number] DOB:  Oct 12, 2000 Patient phone:  [phone] (home)  Patient address:   [address] [place] [address],  Total Time spent with patient: 15 minutes  Date of Admission:  03/03/2019 Date of Discharge: 03/07/19  Reason for Admission:  Polysubstance dependence with overdose on Xanax  Principal Problem: MDD (major depressive disorder) Discharge Diagnoses: Principal Problem:   MDD (major depressive disorder)   Past Psychiatric History: Patient was last admitted to the psychiatric hospital in [place] of this year, 2020. He had been placed under involuntary commitment previously.  He had also been seen by psychiatry in the emergency department at [facility] on 07/22/2014 for suicidal ideation and irritability.  He has been previously diagnosed with bipolar disorder and has been previously prescribed Risperdal as well as Neurontin.  He has a history of polysubstance dependence as well.  Past Medical History:  Past Medical History:  Diagnosis Date  . Bipolar 1 disorder (HCC)   . Hypertension     Past Surgical History:  Procedure Laterality Date  . CIRCUMCISION     Family History:  Family History  Problem Relation Age of Onset  . Heart attack Maternal Grandfather   . Bipolar disorder Maternal Grandfather   . Schizophrenia Maternal Grandfather   . Stroke Paternal Grandfather   . Depression Mother   . Anxiety disorder Mother   . ADD / ADHD Brother   . Depression Maternal Grandmother   . Anxiety disorder Maternal Grandmother   . Migraines Other    Family Psychiatric  History: Bipolar disorder in a grandfather, schizophrenia in a grandfather, depression in mother, anxiety in mother, attention problems in brother. Social History:  Social History   Substance and Sexual Activity  Alcohol Use Yes  . Alcohol/week: 2.0 standard drinks  . Types: 2 Cans of beer per  week   Comment: occ     Social History   Substance and Sexual Activity  Drug Use Yes  . Frequency: 7.0 times per week  . Types: Marijuana, Heroin    Social History   Socioeconomic History  . Marital status: Single    Spouse name: Not on file  . Number of children: Not on file  . Years of education: Not on file  . Highest education level: Not on file  Occupational History  . Not on file  Tobacco Use  . Smoking status: Current Every Day Smoker    Packs/day: 1.00    Years: 7.00    Pack years: 7.00    Types: Cigarettes  . Smokeless tobacco: Never Used  . Tobacco comment: Tried smoking and dipping once  Substance and Sexual Activity  . Alcohol use: Yes    Alcohol/week: 2.0 standard drinks    Types: 2 Cans of beer per week    Comment: occ  . Drug use: Yes    Frequency: 7.0 times per week    Types: Marijuana, Heroin  . Sexual activity: Yes    Birth control/protection: Condom  Other Topics Concern  . Not on file  Social History Narrative  . Not on file   Social Determinants of Health   Financial Resource Strain:   . Difficulty of Paying Living Expenses: Not on file  Food Insecurity:   . Worried About Programme researcher, broadcasting/film/video in the Last Year: Not on file  . Ran Out of Food in the Last Year: Not on file  Transportation Needs:   . Freight forwarder (Medical): Not on file  . Lack of Transportation (Non-Medical): Not on file  Physical Activity:   . Days of Exercise per Week: Not on file  . Minutes of Exercise per Session: Not on file  Stress:   . Feeling of Stress : Not on file  Social Connections:   . Frequency of Communication with Friends and Family: Not on file  . Frequency of Social Gatherings with Friends and Family: Not on file  . Attends Religious Services: Not on file  . Active Member of Clubs or Organizations: Not on file  . Attends Banker Meetings: Not on file  . Marital Status: Not on file    Hospital Course:  From admission H&P:  Patient is a 19 year old male who presented as a voluntary admission to the [facility] emergency department on 03/03/2019 after what he reported as "I do not know, I took too many Xanax". The patient has a longstanding history of polysubstance dependence. He has multiple emergency room visits secondary to substances, and his last admission to our facility was on 08/30/2018. Prior to that he had been also admitted to the psychiatric hospital in April 2020. He is not a very good historian, is lying in bed and does not appear to be very motivated or in significant withdrawal at this point. Collateral information from his father detailed the fact that the patient had been grieving the loss of his younger brother who died earlier this year. The patient also reports having multiple court dates coming up in January 2021 for assault. He had been prescribed mirtazapine on discharge, but admitted he did not follow-up with psychiatric treatment or continue psychiatric medications. The patient's father stated that the patient had a plan to kill himself on January 31 which is the anniversary of the death of his brother. The patient also received Narcan last night because of a previous overdose on heroin and Xanax prior to that. He was admitted to the hospital for evaluation.  Mr. Viscomi was admitted for polysubstance dependence after overdose on Xanax and heroin requiring Narcan. He remained on the [facility] unit for five days. He was started on Remeron and Neurontin. He participated in group therapy on the unit. He responded well to treatment with no adverse effects reported. He has shown improved mood, affect, sleep, and interaction. He denies any SI/HI/AVH and contracts for safety. He denies withdrawal symptoms. He denies cravings. He is requesting discharge. He states intent to follow up with rehab for substance use. He is discharging on the medications listed below. He agrees to follow up at [facility], and [facility] (see below). Patient is provided with prescriptions for medications upon discharge. He is discharging home with family.  Physical Findings: AIMS: Facial and Oral Movements Muscles of Facial Expression: None, normal Lips and Perioral Area: None, normal Jaw: None, normal Tongue: None, normal,Extremity Movements Upper (arms, wrists, hands, fingers): None, normal Lower (legs, knees, ankles, toes): None, normal, Trunk Movements Neck, shoulders, hips: None, normal, Overall Severity Severity of abnormal movements (highest score from questions above): None, normal Incapacitation due to abnormal movements: None, normal Patient's awareness of abnormal movements (rate only patient's report): No Awareness, Dental Status Current problems with teeth and/or dentures?: No Does patient usually wear dentures?: No  CIWA:    COWS:  COWS Total Score: 0  Musculoskeletal: Strength & Muscle Tone: within normal limits Gait & Station: normal Patient leans: N/A  Psychiatric Specialty Exam: Physical  Exam  Nursing note and vitals reviewed. Constitutional: He is oriented to person, place, and time. He appears well-developed and well-nourished.  Cardiovascular: Normal rate.  Respiratory: Effort normal.  Neurological: He is alert and oriented to person, place, and time.    Review of Systems  Constitutional: Negative.   Respiratory: Negative for cough and shortness of breath.   Psychiatric/Behavioral: Negative for agitation, behavioral problems, dysphoric mood, hallucinations, self-injury, sleep disturbance and suicidal ideas. The patient is not nervous/anxious.     Blood pressure 138/78, pulse 94, temperature 98.5 F (36.9 C), resp. rate 18, height 5\' 7"  (1.702 m), weight 76.7 kg, SpO2 97 %.Body mass index is 26.47 kg/m.  See MD's discharge SRA      Has this patient used any form of tobacco in the last 30 days? (Cigarettes, Smokeless Tobacco, Cigars, and/or Pipes)  Yes, A prescription  for an FDA-approved tobacco cessation medication was offered at discharge and the patient refused  Blood Alcohol level:  Lab Results  Component Value Date   [facility] <10 03/02/2019   ETH <10 16/12/9602    Metabolic Disorder Labs:  Lab Results  Component Value Date   HGBA1C 5.0 03/04/2019   MPG 96.8 03/04/2019   Lab Results  Component Value Date   PROLACTIN 12.4 03/04/2019   Lab Results  Component Value Date   CHOL 122 03/04/2019   TRIG 41 03/04/2019   HDL 46 03/04/2019   CHOLHDL 2.7 03/04/2019   VLDL 8 03/04/2019   LDLCALC 68 03/04/2019   LDLCALC  10/04/2007    88        Total Cholesterol/HDL:CHD Risk Coronary Heart Disease Risk Table                     Men   Women  1/2 Average Risk   3.4   3.3    See Psychiatric Specialty Exam and Suicide Risk Assessment completed by Attending Physician prior to discharge.  Discharge destination:  Home  Is patient on multiple antipsychotic therapies at discharge:  No   Has Patient had three or more failed trials of antipsychotic monotherapy by history:  No  Recommended Plan for Multiple Antipsychotic Therapies: NA   Allergies as of 03/07/2019      Reactions   Tylenol [acetaminophen] Hives, Itching      Medication List    STOP taking these medications   divalproex 500 MG DR tablet Commonly known as: DEPAKOTE   OLANZapine 10 MG tablet Commonly known as: ZYPREXA   Suboxone 4-1 MG Film Generic drug: Buprenorphine HCl-Naloxone HCl   traZODone 50 MG tablet Commonly known as: DESYREL     TAKE these medications     Indication  gabapentin 300 MG capsule Commonly known as: NEURONTIN Take 1 capsule (300 mg total) by mouth 3 (three) times daily. What changed: when to take this  Indication: Agitation   mirtazapine 15 MG tablet Commonly known as: REMERON Take 1 tablet (15 mg total) by mouth at bedtime.  Indication: Major Depressive Disorder      Follow-up [facility]. Call.   Why: A referral for residential substance use treatment was faxed on 03/07/19. Please call and ask to speak with admissions for updates regarding your acceptance.  Contact information: [place] [address] [phone]        [facility]. Call.   Why: To pursue residential substance use treatment, please call and ask to speak with  intake coordinator "Ms.June." If you are accepted, you will need to bring a 30 day supply of your medications.  Contact information: [address] [place] [address] [phone]        [facility] Follow up.   Contact information: [address] [place] [address] [phone]           Follow-up recommendations: Activity as tolerated. Diet as recommended by primary care physician. Keep all scheduled follow-up appointments as recommended.   Comments:   Patient is instructed to take all prescribed medications as recommended. Report any side effects or adverse reactions to your outpatient psychiatrist. Patient is instructed to abstain from alcohol and illegal drugs while on prescription medications. In the event of worsening symptoms, patient is instructed to call the crisis hotline, 911, or go to the nearest emergency department for evaluation and treatment.  Signed: Aldean Baker, NP 03/07/2019, 10:41 AM

## 2019-03-07 NOTE — Progress Notes (Signed)
Recreation Therapy Notes  Date:  1.4.21 Time: 0930 Location: [address] Dayroom  Group Topic: Stress Management  Goal Area(s) Addresses:  Patient will identify positive stress management techniques. Patient will identify benefits of using stress management post d/c.  Intervention: Stress Management  Activity :  Meditation.  LRT played a meditation that focused on being resilient in the face of adversity.  Patients were to listen and follow along as meditation was played.  Education:  Stress Management, Discharge Planning.   Education Outcome: Acknowledges Education  Clinical Observations/Feedback: Pt did not attend group session.    Pamala Hayman, LRT/CTRS         Chandy Tarman A 03/07/2019 10:56 AM 

## 2019-03-07 NOTE — BHH Suicide Risk Assessment (Signed)
[facility] Discharge Suicide Risk Assessment   Principal Problem: MDD (major depressive disorder) Discharge Diagnoses: Principal Problem:   MDD (major depressive disorder)   Total Time spent with patient: 45 minutes  Musculoskeletal: Strength & Muscle Tone: within normal limits Gait & Station: normal Patient leans: N/A  Psychiatric Specialty Exam: Review of Systems  Blood pressure 138/78, pulse 94, temperature 98.5 F (36.9 C), resp. rate 18, height 5\' 7"  (1.702 m), weight 76.7 kg, SpO2 97 %.Body mass index is 26.47 kg/m.  General Appearance: Casual  Eye Contact::  Good  Speech:  Clear and Coherent409  Volume:  Normal  Mood:  Euthymic  Affect:  Full Range  Thought Process:  Coherent and Goal Directed  Orientation:  Full (Time, Place, and Person)  Thought Content:  Rumination  Suicidal Thoughts:  No  Homicidal Thoughts:  No  Memory:  Immediate;   Fair Recent;   Fair Remote;   Fair  Judgement:  Intact  Insight:  Fair  Psychomotor Activity:  Normal  Concentration:  Good  Recall:  Good  Fund of Knowledge:Good  Language: Good  Akathisia:  neg  Handed:  Right  AIMS (if indicated):     Assets:  Communication Skills Desire for Improvement  Sleep:  Number of Hours: 5.75  Cognition: WNL  ADL's:  Intact   Mental Status Per Nursing Assessment::   On Admission:  Suicidal ideation indicated by patient, Plan includes specific time, place, or method, Intention to act on suicide plan, Self-harm thoughts, Belief that plan would result in death, Suicide plan  Demographic Factors:  Male and Unemployed  Loss Factors: Loss of significant relationship  Historical Factors: NA  Risk Reduction Factors:   Positive therapeutic relationship and Positive coping skills or problem solving skills  Continued Clinical Symptoms:  Previous Psychiatric Diagnoses and Treatments  Cognitive Features That Contribute To Risk:  None    Suicide Risk:  Minimal: No identifiable suicidal ideation.   Patients presenting with no risk factors but with morbid ruminations; may be classified as minimal risk based on the severity of the depressive symptoms    Plan Of Care/Follow-up recommendations:  Activity:  full  Eldena Dede, MD 03/07/2019, 9:50 AM

## 2019-03-07 NOTE — Progress Notes (Signed)
  [facility] Adult Case Management Discharge Plan :  Will you be returning to the same living situation after discharge:  Yes,  reports he will return to his father's home. At discharge, do you have transportation home?: Yes,  reports his aunt will pick him up. Do you have the ability to pay for your medications: Yes,  Medicaid.  Release of information consent forms completed and in the chart.  Patient to Follow up at: Follow-up Information    [facility]. Call.   Why: A referral for residential substance use treatment was faxed on 03/07/19. Please call and ask to speak with admissions for updates regarding your acceptance.  Contact information: [address] [place] [address] [phone]        [facility]. Call.   Why: To pursue residential substance use treatment, please call and ask to speak with intake coordinator "Ms.June." If you are accepted, you will need to bring a 30 day supply of your medications.  Contact information: [address] [place] [address] [phone]        [facility]. Call on 03/14/2019.   Why: Your hospital follow up appointment for medication management and therapy is Monday, 03/14/19 at 10:30am. This appointment will be held virtually over the phone. Please expect a phone call from [phone]. For additional questions, please call agency. Contact information: [address] [place] [address] [phone]           Next level of care provider has access to [facility] Link:no  Safety Planning and Suicide Prevention discussed: Yes,  with patient. Patient revoked consents to speak with father.   Has patient been referred to the Quitline?: Patient refused referral  Patient has been referred for addiction treatment: Yes  Darreld Mclean, LCSWA 03/07/2019, 11:34 AM

## 2019-03-07 NOTE — Progress Notes (Signed)
Patient ID: Arthur Martinez, male   DOB: 27-Jan-2001, 18 y.o.   MRN: [id number] DAR Note: Pt with blunted affect, depressed mood, denied SI/HI/AVH, took all meds as ordered, is being maintained on Q15 minute checks for safety. Pt is currently in bed, and sleeping with no signs of distress.

## 2019-03-07 NOTE — Plan of Care (Signed)
Discharge note  Patient verbalizes readiness for discharge. Follow up plan explained, AVS, Transition record and SRA given. Prescriptions and teaching provided. Belongings returned and signed for. Suicide safety plan completed and signed. Patient verbalizes understanding. Patient denies SI/HI and assures this Clinical research associate he will seek assistance should that change. Patient discharged to lobby where aunt was waiting.  Problem: Education: Goal: Knowledge of [facility] Education information/materials will improve Outcome: Adequate for Discharge Goal: Emotional status will improve Outcome: Adequate for Discharge Goal: Mental status will improve Outcome: Adequate for Discharge Goal: Verbalization of understanding the information provided will improve Outcome: Adequate for Discharge   Problem: Activity: Goal: Interest or engagement in activities will improve Outcome: Adequate for Discharge Goal: Sleeping patterns will improve Outcome: Adequate for Discharge   Problem: Coping: Goal: Ability to verbalize frustrations and anger appropriately will improve Outcome: Adequate for Discharge Goal: Ability to demonstrate self-control will improve Outcome: Adequate for Discharge   Problem: Health Behavior/Discharge Planning: Goal: Identification of resources available to assist in meeting health care needs will improve Outcome: Adequate for Discharge Goal: Compliance with treatment plan for underlying cause of condition will improve Outcome: Adequate for Discharge   Problem: Physical Regulation: Goal: Ability to maintain clinical measurements within normal limits will improve Outcome: Adequate for Discharge   Problem: Safety: Goal: Periods of time without injury will increase Outcome: Adequate for Discharge   Problem: Education: Goal: Ability to state activities that reduce stress will improve Outcome: Adequate for Discharge   Problem: Coping: Goal: Ability to identify and develop  effective coping behavior will improve Outcome: Adequate for Discharge   Problem: Self-Concept: Goal: Ability to identify factors that promote anxiety will improve Outcome: Adequate for Discharge Goal: Level of anxiety will decrease Outcome: Adequate for Discharge Goal: Ability to modify response to factors that promote anxiety will improve Outcome: Adequate for Discharge   Problem: Education: Goal: Utilization of techniques to improve thought processes will improve Outcome: Adequate for Discharge Goal: Knowledge of the prescribed therapeutic regimen will improve Outcome: Adequate for Discharge   Problem: Activity: Goal: Interest or engagement in leisure activities will improve Outcome: Adequate for Discharge Goal: Imbalance in normal sleep/wake cycle will improve Outcome: Adequate for Discharge   Problem: Coping: Goal: Coping ability will improve Outcome: Adequate for Discharge Goal: Will verbalize feelings Outcome: Adequate for Discharge   Problem: Health Behavior/Discharge Planning: Goal: Ability to make decisions will improve Outcome: Adequate for Discharge Goal: Compliance with therapeutic regimen will improve Outcome: Adequate for Discharge   Problem: Role Relationship: Goal: Will demonstrate positive changes in social behaviors and relationships Outcome: Adequate for Discharge   Problem: Safety: Goal: Ability to disclose and discuss suicidal ideas will improve Outcome: Adequate for Discharge Goal: Ability to identify and utilize support systems that promote safety will improve Outcome: Adequate for Discharge   Problem: Self-Concept: Goal: Will verbalize positive feelings about self Outcome: Adequate for Discharge Goal: Level of anxiety will decrease Outcome: Adequate for Discharge   Problem: Education: Goal: Verbalization of understanding the information provided will improve Outcome: Adequate for Discharge   Problem: Coping: Goal: Level of anxiety will  decrease Outcome: Adequate for Discharge Goal: Ability to identify and utilize appropriate coping strategies will improve Outcome: Adequate for Discharge Goal: Ability to identify and utilize available resources and services will improve Outcome: Adequate for Discharge Goal: Will verbalize feelings Outcome: Adequate for Discharge   Problem: Activity: Goal: Will identify at least one activity in which they can participate Outcome: Adequate for Discharge   Problem:  Coping: Goal: Ability to identify and develop effective coping behavior will improve Outcome: Adequate for Discharge Goal: Ability to interact with others will improve Outcome: Adequate for Discharge Goal: Demonstration of participation in decision-making regarding own care will improve Outcome: Adequate for Discharge Goal: Ability to use eye contact when communicating with others will improve Outcome: Adequate for Discharge   Problem: Health Behavior/Discharge Planning: Goal: Identification of resources available to assist in meeting health care needs will improve Outcome: Adequate for Discharge   Problem: Self-Concept: Goal: Will verbalize positive feelings about self Outcome: Adequate for Discharge   Problem: Education: Goal: Knowledge of disease or condition will improve Outcome: Adequate for Discharge Goal: Understanding of discharge needs will improve Outcome: Adequate for Discharge   Problem: Health Behavior/Discharge Planning: Goal: Ability to identify changes in lifestyle to reduce recurrence of condition will improve Outcome: Adequate for Discharge Goal: Identification of resources available to assist in meeting health care needs will improve Outcome: Adequate for Discharge   Problem: Physical Regulation: Goal: Complications related to the disease process, condition or treatment will be avoided or minimized Outcome: Adequate for Discharge   Problem: Safety: Goal: Ability to remain free from injury  will improve Outcome: Adequate for Discharge

## 2019-03-07 NOTE — [facility] Suicide Risk Assessment (Signed)
[facility] INPATIENT:  Family/Significant Other Suicide Prevention Education  Suicide Prevention Education:  Patient Refusal for Family/Significant Other Suicide Prevention Education: The patient Arthur Martinez has refused to provide written consent for family/significant other to be provided Family/Significant Other Suicide Prevention Education during admission and/or prior to discharge.  Physician notified.  Patient revoked consents for CSW to speak with patient's father Arthur Martinez [phone]. Father has called and requested a call back. CSW informed father that she did not have permission to release information.   Father earlier expressed concerns for discharge and asked CSW to look into residential treatment options for this patient, including but not limited to: [facility], [facility], [facility], and [facility].  Arthur Martinez 03/07/2019, 9:55 AM

## 2019-03-16 ENCOUNTER — Encounter (HOSPITAL_COMMUNITY): Payer: Self-pay | Admitting: Emergency Medicine

## 2019-03-16 ENCOUNTER — Emergency Department (HOSPITAL_COMMUNITY)
Admission: EM | Admit: 2019-03-16 | Discharge: 2019-03-17 | Disposition: A | Discharge status: Home / Self Care | Payer: Medicaid Other | Attending: Emergency Medicine | Admitting: Emergency Medicine

## 2019-03-16 DIAGNOSIS — F918 Other conduct disorders: Secondary | ICD-10-CM | POA: Insufficient documentation

## 2019-03-16 DIAGNOSIS — R45851 Suicidal ideations: Secondary | ICD-10-CM | POA: Insufficient documentation

## 2019-03-16 DIAGNOSIS — Z046 Encounter for general psychiatric examination, requested by authority: Secondary | ICD-10-CM | POA: Insufficient documentation

## 2019-03-16 DIAGNOSIS — Z79899 Other long term (current) drug therapy: Secondary | ICD-10-CM | POA: Insufficient documentation

## 2019-03-16 DIAGNOSIS — Z634 Disappearance and death of family member: Secondary | ICD-10-CM | POA: Insufficient documentation

## 2019-03-16 DIAGNOSIS — M25561 Pain in right knee: Secondary | ICD-10-CM | POA: Diagnosis not present

## 2019-03-16 DIAGNOSIS — F314 Bipolar disorder, current episode depressed, severe, without psychotic features: Secondary | ICD-10-CM | POA: Insufficient documentation

## 2019-03-16 DIAGNOSIS — F17210 Nicotine dependence, cigarettes, uncomplicated: Secondary | ICD-10-CM | POA: Diagnosis not present

## 2019-03-16 DIAGNOSIS — M25562 Pain in left knee: Secondary | ICD-10-CM | POA: Insufficient documentation

## 2019-03-16 DIAGNOSIS — W19XXXA Unspecified fall, initial encounter: Secondary | ICD-10-CM | POA: Diagnosis not present

## 2019-03-16 DIAGNOSIS — Z20822 Contact with and (suspected) exposure to covid-19: Secondary | ICD-10-CM | POA: Insufficient documentation

## 2019-03-16 DIAGNOSIS — Z9114 Patient's other noncompliance with medication regimen: Secondary | ICD-10-CM | POA: Insufficient documentation

## 2019-03-16 DIAGNOSIS — F1910 Other psychoactive substance abuse, uncomplicated: Secondary | ICD-10-CM | POA: Insufficient documentation

## 2019-03-16 DIAGNOSIS — I10 Essential (primary) hypertension: Secondary | ICD-10-CM | POA: Diagnosis not present

## 2019-03-16 DIAGNOSIS — R4689 Other symptoms and signs involving appearance and behavior: Secondary | ICD-10-CM

## 2019-03-16 LAB — RAPID URINE DRUG SCREEN, HOSP PERFORMED
Amphetamines: NOT DETECTED
Barbiturates: NOT DETECTED
Benzodiazepines: NOT DETECTED
Cocaine: NOT DETECTED
Opiates: POSITIVE — AB
Tetrahydrocannabinol: POSITIVE — AB

## 2019-03-16 LAB — COMPREHENSIVE METABOLIC PANEL
ALT: 19 U/L (ref 0–44)
AST: 23 U/L (ref 15–41)
Albumin: 3.7 g/dL (ref 3.5–5.0)
Alkaline Phosphatase: 89 U/L (ref 38–126)
Anion gap: 10 (ref 5–15)
BUN: 9 mg/dL (ref 6–20)
CO2: 24 mmol/L (ref 22–32)
Calcium: 9.1 mg/dL (ref 8.9–10.3)
Chloride: 105 mmol/L (ref 98–111)
Creatinine, Ser: 0.84 mg/dL (ref 0.61–1.24)
GFR calc Af Amer: >60 mL/min (ref >60)
GFR calc non Af Amer: >60 mL/min (ref >60)
Glucose, Bld: 103 mg/dL — ABNORMAL HIGH (ref 70–99)
Potassium: 4 mmol/L (ref 3.5–5.1)
Sodium: 139 mmol/L (ref 135–145)
Total Bilirubin: 0.3 mg/dL (ref 0.3–1.2)
Total Protein: 6.8 g/dL (ref 6.5–8.1)

## 2019-03-16 LAB — CBC
HCT: 40.7 % (ref 39.0–52.0)
Hemoglobin: 13.4 g/dL (ref 13.0–17.0)
MCH: 31.3 pg (ref 26.0–34.0)
MCHC: 32.9 g/dL (ref 30.0–36.0)
MCV: 95.1 fL (ref 80.0–100.0)
Platelets: 320 10*3/uL (ref 150–400)
RBC: 4.28 MIL/uL (ref 4.22–5.81)
RDW: 12.5 % (ref 11.5–15.5)
WBC: 8.9 10*3/uL (ref 4.0–10.5)
nRBC: 0 % (ref 0.0–0.2)

## 2019-03-16 LAB — ETHANOL: Alcohol, Ethyl (B): <10 mg/dL (ref <10)

## 2019-03-16 MED ORDER — ZIPRASIDONE MESYLATE 20 MG IM SOLR
20.0000 mg | Freq: Once | INTRAMUSCULAR | Status: AC
  Administered 2019-03-16: 12:00:00 20 mg via INTRAMUSCULAR
  Filled 2019-03-16: qty 20

## 2019-03-16 NOTE — ED Notes (Signed)
Pt was sleeping from getting 20mg  of geodon  He weas trasnferred from hw 21  Sitter at   The bedside

## 2019-03-16 NOTE — ED Notes (Signed)
Pt sleeping. 

## 2019-03-16 NOTE — ED Notes (Signed)
Sitter here
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
## 2019-03-16 NOTE — ED Provider Notes (Signed)
[facility] EMERGENCY DEPARTMENT Provider Note   CSN: [id number] Arrival date & time: 03/16/19  1116     History Chief Complaint  Patient presents with  . Medical Clearance    Arthur Martinez is a 19 y.o. male w PMHx polysubstance abuse, ADHS, MDD, aggressive behavior, BP1d/o, presenting to the ED via [facility] under IVC for aggressive behavior and threatening suicide. Pt reportedly grieving the loss of his brother and voicing thoughts of ending his life to his father. He arrived under IVC and reportedly was verbally and physically aggressive, requiring geodon in triage for the safety of himself and ED staff.  On my evaluation, patient endorses "anger problems."  He states "but it will be okay."  He denies SI, HI, auditory visual hallucinations.  Denies any drug use though UDS obtained in triage is positive for both opiates and cannabis.  He denies any medical complaints today.  The history is provided by the patient (nursing staff, IVC paperwork).       Past Medical History:  Diagnosis Date  . Bipolar 1 disorder (HCC)   . Hypertension     Patient Active Problem List   Diagnosis Date Noted  . Polysubstance abuse (HCC) 10/04/2018  . MDD (major depressive disorder) 08/30/2018  . Severe major depression, single episode, without psychotic features (HCC) 06/22/2018  . Tension headache 03/08/2014  . Anxiety state 03/08/2014  . Insomnia 03/08/2014  . Aggressive behavior 03/08/2014  . 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 CIRCUMCISION         Family History  Problem Relation Age of Onset  . Heart attack Maternal Grandfather   . Bipolar disorder Maternal Grandfather   . Schizophrenia Maternal Grandfather   . Stroke Paternal Grandfather   . Depression Mother   . Anxiety disorder Mother   . ADD / ADHD Brother   . Depression Maternal Grandmother   . Anxiety disorder  Maternal Grandmother   . Migraines Other     Social History   Tobacco Use  . Smoking status: Current Every Day Smoker    Packs/day: 1.00    Years: 7.00    Pack years: 7.00    Types: Cigarettes  . Smokeless tobacco: Never Used  . Tobacco comment: Tried smoking and dipping once  Substance Use Topics  . Alcohol use: Yes    Alcohol/week: 2.0 standard drinks    Types: 2 Cans of beer per week    Comment: occ  . Drug use: Yes    Frequency: 7.0 times per week    Types: Marijuana, Heroin    Home Medications Prior to Admission medications   Medication Sig Start Date End Date Taking? Authorizing Provider  gabapentin (NEURONTIN) 300 MG capsule Take 1 capsule (300 mg total) by mouth 3 (three) times daily. 03/07/19   Malvin Johns, MD  mirtazapine (REMERON) 15 MG tablet Take 1 tablet (15 mg total) by mouth at bedtime. 03/07/19   Malvin Johns, MD    Allergies    Tylenol [acetaminophen]  Review of Systems   Review of Systems  All other systems reviewed and are negative.   Physical Exam Updated Vital Signs BP (!) 147/81 (BP Location: Right Arm)   Pulse 80   Temp 98.6 F (37 C) (Oral)   Resp 16   SpO2 100%   Physical Exam Vitals and nursing note reviewed.  Constitutional:      General: He is not in  acute distress.    Appearance: He is well-developed.  HENT:     Head: Normocephalic and atraumatic.  Eyes:     Conjunctiva/sclera: Conjunctivae normal.  Cardiovascular:     Rate and Rhythm: Normal rate.  Pulmonary:     Effort: Pulmonary effort is normal.  Abdominal:     Palpations: Abdomen is soft.  Skin:    General: Skin is warm.  Neurological:     Mental Status: He is alert.  Psychiatric:        Attention and Perception: Attention normal.        Mood and Affect: Affect is flat.        Speech: Speech normal.        Behavior: Behavior normal. Behavior is cooperative.     Comments: Pt sleepy though easily arousable to verbal stimuli. Calm and cooperative on exam. Answering  most questions with yes or no answers.      ED Results / Procedures / Treatments   Labs (all labs ordered are listed, but only abnormal results are displayed) Labs Reviewed  COMPREHENSIVE METABOLIC PANEL - Abnormal; Notable for the following components:      Result Value   Glucose, Bld 103 (*)    All other components within normal limits  RAPID URINE DRUG SCREEN, HOSP PERFORMED - Abnormal; Notable for the following components:   Opiates POSITIVE (*)    Tetrahydrocannabinol POSITIVE (*)    All other components within normal limits  ETHANOL  CBC    EKG None  Radiology No results found.  Procedures Procedures (including critical care time)  Medications Ordered in ED Medications  ziprasidone (GEODON) injection 20 mg (20 mg Intramuscular Given 03/16/19 1150)    ED Course  I have reviewed the triage vital signs and the nursing notes.  Pertinent labs & imaging results that were available during my care of the patient were reviewed by me and considered in my medical decision making (see chart for details).  Clinical Course as of Mar 15 1433  Wed Mar 16, 2019  1325 Pt evaluated after receiving geodon in triage for aggressive behavior. He is resting, though easily arousable to verbal stimuli. Will re-evaluate once pt is more awake. Sitter at bedside.   [JR]    Clinical Course User Index [JR] Lavin Petteway, [place] N, PA-C   MDM Rules/Calculators/A&P                      Patient brought in by [employer] with IVC per patient's father for aggressive behavior, threatening suicide with grieving the loss of his brother, polysubstance abuse and concern for the safety of himself and others.  On arrival to the ED, he was reportedly verbally and physically aggressive requiring IM Geodon.  On my evaluation he is more calm and cooperative.  He endorses "anger problems" though denies SI, HI, hallucinations, drug or alcohol use.  UDS is positive for opiates and cannabis.  Medical clearance labs are  otherwise unremarkable.  He is cleared for TTS evaluation.   Final Clinical Impression(s) / ED Diagnoses Final diagnoses:  Aggressive behavior  Polysubstance abuse [facility])    Rx / [place] Orders ED Discharge Orders    None       Ruqayya Ventress, [place] N, PA-C 03/16/19 [place], [place], DO 03/17/19 [phone]

## 2019-03-16 NOTE — ED Notes (Addendum)
Pt belongings inventoried and changed into purple scrubs Belongings put in locker #5 Wanded by security

## 2019-03-16 NOTE — ED Notes (Signed)
Telepsych in process 

## 2019-03-16 NOTE — ED Notes (Signed)
Received report fromhis nurse in the hallway.  The pt has been using multiple drugs  His fasther has found him unresponsive  Several times when he has come home from work.. recent death of his brother  He stopped taking his mental health meds  Unknown length of time

## 2019-03-16 NOTE — ED Notes (Signed)
The pt just woke up ate his food and went to the br

## 2019-03-16 NOTE — ED Notes (Signed)
The pt reports that he fell 2 days ago and since then he has had pain in both his knees

## 2019-03-16 NOTE — ED Triage Notes (Signed)
Pt arrives With [employer] in custody  Very aggressive behavior , yelling and cursing pt IVC per his dad for being aggressive aND  DANGER   To self and others and upset that his brother died

## 2019-03-17 LAB — SARS CORONAVIRUS 2 (TAT 6-24 HRS): SARS Coronavirus 2: NEGATIVE

## 2019-03-17 NOTE — ED Notes (Signed)
Pt d/c home per MD order. Discharge summary reviewed with pt, pt verbalizes understanding. Denies SI/HI. Signed e-signature. Ambulatory off unit.

## 2019-03-17 NOTE — Progress Notes (Signed)
Pt is psych cleared. Outpatient resources faxed to [facility] ED.   Wells Guiles, LCSW, LCAS Disposition CSW [facility]/TTS [phone]

## 2019-03-17 NOTE — ED Notes (Signed)
IVC-SI Breakfast ordered 

## 2019-03-17 NOTE — ED Notes (Signed)
Pt given faxed resources provided by social work.

## 2019-03-17 NOTE — Progress Notes (Signed)
Patient ID: Arthur Martinez, male   DOB: 09-22-2000, 19 y.o.   MRN: [id number]   Reassessment    [id number] Arthur Martinez is a 19 y.o. male who presented to [facility] under IVC (petitioner is [name], Arthur Martinez, Arthur Martinez. -- [phone]) due to expressed suicidal ideation and aggression.  Pt lives in [place] with his father, and he said he is earning his GED.  Pt is not followed by an outpatient psychiatric provider.  Pt was last assessed by TTS on 03/03/2019.  On that date, Pt presented to [facility] after overdosing on Xanax.    Per hospital report:  19 y.o.malew PMHx polysubstance abuse, ADHS, MDD, aggressive behavior, BP1d/o, presenting to the ED via [employer] under IVC for aggressive behavior and threatening suicide. Pt reportedly grieving the loss of his brother and voicing thoughts of ending his life to his father. He arrived under IVC and reportedly was verbally and physically aggressive, requiring geodon in triage for the safety of himself and ED staff.On my evaluation, patient endorses "anger problems." He states "but it will be okay." He denies SI, HI, auditory visual hallucinations. Denies any drug use though UDS obtained in triage is positive for both opiates and cannabis.  BAC was not available at time of assessment.  Pt reported that he does not know why he is at the hospital.  ''I woke up, and the police were bringing me to the hospital.''  Pt reported that he had argued with his mother today and then took a nap.  He denied suicidal ideation, homicidal ideation, hallucination, self-injurious behavior.  Pt reported, ''I lose my temper sometimes.''  Pt admitted to using marijuana a few times a week.  He denied opioid/heroin use, although he tested positive.  ''It's been a while.''  Psychiatric evaluation: This is a 19 year old male who presented to [facility] ED for concerns as noted above. During this evaluation, he is alert and oriented x4, calm and cooperative.  He reports he was IVC'd by his mother  after they had an argument and he admits to becoming angry. He denies that during the argument, he became physically aggressive or made threats to harm himself or anyone else. Reports he had calmed down, went to sleep, and the next day he was awakened by the police stating they were taking him tot hte hospital. He states," I was just at behavioral health a few weeks ago. It was just my mom who put me here."  He currently denies SI, HI or psychosis. He has a long history of substance abuse and admits that he continues to abuse opiates with his last use two days ago. He states," I was hoping to get back on my medication for this but I haven't been able to go anywhere because I keep coming to the hospital." The medication of reference is suboxone. H reports he has not followed up with [facility] to continue ongoing mental health evaluation and treatment. He does have quite a history of mental health issues including polysubstance abuse and agression and he was strongly encourage to follow-up with outpatient psychiatric services.   He does not give verbal consent to speak with mother or father to obtain additional information although IVC was reviewed prior to this evaluation.   Disposition: Patient is being psychiatrically cleared at this time. I have spoke to CSW here at [facility] to send over resources to include a suboxne program so patient can continue with this treatment. He was strongly encouraged to follow-up with [facility]. I am  requesting peer support as well to speak with patient prior to discharge,    ED nurse updated on current disposition/psychiatric clearance who will forward this infromation to the EDP.

## 2019-03-17 NOTE — Discharge Instructions (Addendum)
Please follow up with one of the outpatient resources provided within 1-3 days.  Return to the ER for new or worsening symptoms including thoughts of hurting yourself, thoughts of hurting others, hearing/seeing things, or any other concerns.

## 2019-03-17 NOTE — ED Notes (Signed)
Tele psych machine at bedside- pt speaking with psych NP  

## 2019-04-19 ENCOUNTER — Emergency Department (HOSPITAL_COMMUNITY)
Admission: EM | Admit: 2019-04-19 | Discharge: 2019-04-20 | Disposition: A | Discharge status: Home / Self Care | Payer: Medicaid Other | Attending: Emergency Medicine | Admitting: Emergency Medicine

## 2019-04-19 ENCOUNTER — Encounter (HOSPITAL_COMMUNITY): Payer: Self-pay | Admitting: Emergency Medicine

## 2019-04-19 DIAGNOSIS — F1994 Other psychoactive substance use, unspecified with psychoactive substance-induced mood disorder: Secondary | ICD-10-CM | POA: Diagnosis present

## 2019-04-19 DIAGNOSIS — Z20822 Contact with and (suspected) exposure to covid-19: Secondary | ICD-10-CM | POA: Diagnosis not present

## 2019-04-19 DIAGNOSIS — Z886 Allergy status to analgesic agent status: Secondary | ICD-10-CM | POA: Insufficient documentation

## 2019-04-19 DIAGNOSIS — R456 Violent behavior: Secondary | ICD-10-CM | POA: Diagnosis not present

## 2019-04-19 DIAGNOSIS — I10 Essential (primary) hypertension: Secondary | ICD-10-CM | POA: Diagnosis not present

## 2019-04-19 DIAGNOSIS — F419 Anxiety disorder, unspecified: Secondary | ICD-10-CM | POA: Diagnosis present

## 2019-04-19 DIAGNOSIS — R4689 Other symptoms and signs involving appearance and behavior: Secondary | ICD-10-CM | POA: Diagnosis present

## 2019-04-19 DIAGNOSIS — F1914 Other psychoactive substance abuse with psychoactive substance-induced mood disorder: Secondary | ICD-10-CM | POA: Diagnosis not present

## 2019-04-19 DIAGNOSIS — F1910 Other psychoactive substance abuse, uncomplicated: Secondary | ICD-10-CM | POA: Diagnosis present

## 2019-04-19 DIAGNOSIS — F902 Attention-deficit hyperactivity disorder, combined type: Secondary | ICD-10-CM | POA: Diagnosis present

## 2019-04-19 DIAGNOSIS — R45851 Suicidal ideations: Secondary | ICD-10-CM | POA: Insufficient documentation

## 2019-04-19 DIAGNOSIS — F17210 Nicotine dependence, cigarettes, uncomplicated: Secondary | ICD-10-CM | POA: Diagnosis not present

## 2019-04-19 DIAGNOSIS — F329 Major depressive disorder, single episode, unspecified: Secondary | ICD-10-CM | POA: Diagnosis not present

## 2019-04-19 DIAGNOSIS — Z79899 Other long term (current) drug therapy: Secondary | ICD-10-CM | POA: Diagnosis not present

## 2019-04-19 LAB — CBC WITH DIFFERENTIAL/PLATELET
Abs Immature Granulocytes: 0.02 10*3/uL (ref 0.00–0.07)
Basophils Absolute: 0 10*3/uL (ref 0.0–0.1)
Basophils Relative: 0 %
Eosinophils Absolute: 0.2 10*3/uL (ref 0.0–0.5)
Eosinophils Relative: 2 %
HCT: 39 % (ref 39.0–52.0)
Hemoglobin: 12.9 g/dL — ABNORMAL LOW (ref 13.0–17.0)
Immature Granulocytes: 0 %
Lymphocytes Relative: 41 %
Lymphs Abs: 3.8 10*3/uL (ref 0.7–4.0)
MCH: 31.4 pg (ref 26.0–34.0)
MCHC: 33.1 g/dL (ref 30.0–36.0)
MCV: 94.9 fL (ref 80.0–100.0)
Monocytes Absolute: 0.5 10*3/uL (ref 0.1–1.0)
Monocytes Relative: 6 %
Neutro Abs: 4.7 10*3/uL (ref 1.7–7.7)
Neutrophils Relative %: 51 %
Platelets: 250 10*3/uL (ref 150–400)
RBC: 4.11 MIL/uL — ABNORMAL LOW (ref 4.22–5.81)
RDW: 12.1 % (ref 11.5–15.5)
WBC: 9.3 10*3/uL (ref 4.0–10.5)
nRBC: 0 % (ref 0.0–0.2)

## 2019-04-19 LAB — RAPID URINE DRUG SCREEN, HOSP PERFORMED
Amphetamines: POSITIVE — AB
Barbiturates: NOT DETECTED
Benzodiazepines: POSITIVE — AB
Cocaine: NOT DETECTED
Opiates: POSITIVE — AB
Tetrahydrocannabinol: POSITIVE — AB

## 2019-04-19 LAB — COMPREHENSIVE METABOLIC PANEL
ALT: 13 U/L (ref 0–44)
AST: 14 U/L — ABNORMAL LOW (ref 15–41)
Albumin: 4 g/dL (ref 3.5–5.0)
Alkaline Phosphatase: 84 U/L (ref 38–126)
Anion gap: 9 (ref 5–15)
BUN: 9 mg/dL (ref 6–20)
CO2: 29 mmol/L (ref 22–32)
Calcium: 8.8 mg/dL — ABNORMAL LOW (ref 8.9–10.3)
Chloride: 104 mmol/L (ref 98–111)
Creatinine, Ser: 1.15 mg/dL (ref 0.61–1.24)
GFR calc Af Amer: >60 mL/min (ref >60)
GFR calc non Af Amer: >60 mL/min (ref >60)
Glucose, Bld: 96 mg/dL (ref 70–99)
Potassium: 3.5 mmol/L (ref 3.5–5.1)
Sodium: 142 mmol/L (ref 135–145)
Total Bilirubin: 0.5 mg/dL (ref 0.3–1.2)
Total Protein: 7.6 g/dL (ref 6.5–8.1)

## 2019-04-19 LAB — RESPIRATORY PANEL BY RT PCR (FLU A&B, COVID)
Influenza A by PCR: NEGATIVE
Influenza B by PCR: NEGATIVE
SARS Coronavirus 2 by RT PCR: NEGATIVE

## 2019-04-19 LAB — ETHANOL: Alcohol, Ethyl (B): <10 mg/dL (ref <10)

## 2019-04-19 NOTE — ED Provider Notes (Signed)
[facility] DEPT Provider Note   CSN: [id number] Arrival date & time: 04/19/19  1330     History Chief Complaint  Patient presents with  . IVC    Arthur Martinez is a 19 y.o. male.  Patient is a 19 year old male with a long history of psychiatric problems and drug abuse presenting to the emergency department under IVC taken out by his mother for suicidal, homicidal ideation as well as drug abuse.  When I speak with the patient he reports that he was at court today and ran into his girlfriend who told him that she was pregnant by another man.  He reports that he got very upset about this.  He tells me that he denies any suicidal or homicidal ideation's.  Reports that he was feeling anxious so he took 0.25 mg of Ativan today.  He reports that he regularly takes Suboxone and denies any other drug or alcohol use.  Denies hallucinations.  Reports that he just got upset after hearing bad news.        Past Medical History:  Diagnosis Date  . Bipolar 1 disorder ([place])   . Hypertension     Patient Active Problem List   Diagnosis Date Noted  . Polysubstance abuse ([place]) 10/04/2018  . MDD (major depressive disorder) 08/30/2018  . Severe major depression, single episode, without psychotic features ([place]) 06/22/2018  . Tension headache 03/08/2014  . Anxiety state 03/08/2014  . Insomnia 03/08/2014  . Aggressive behavior 03/08/2014  . 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 CIRCUMCISION         Family History  Problem Relation Age of Onset  . Heart attack Maternal Grandfather   . Bipolar disorder Maternal Grandfather   . Schizophrenia Maternal Grandfather   . Stroke Paternal Grandfather   . Depression Mother   . Anxiety disorder Mother   . ADD / ADHD Brother   . Depression Maternal Grandmother   . Anxiety disorder Maternal Grandmother   . Migraines Other       Social History   Tobacco Use  . Smoking status: Current Every Day Smoker    Packs/day: 1.00    Years: 7.00    Pack years: 7.00    Types: Cigarettes  . Smokeless tobacco: Never Used  . Tobacco comment: Tried smoking and dipping once  Substance Use Topics  . Alcohol use: Yes    Alcohol/week: 2.0 standard drinks    Types: 2 Cans of beer per week    Comment: occ  . Drug use: Yes    Frequency: 7.0 times per week    Types: Marijuana, Heroin    Comment: +THC, OPIATES    Home Medications Prior to Admission medications   Medication Sig Start Date End Date Taking? Authorizing Provider  gabapentin (NEURONTIN) 300 MG capsule Take 1 capsule (300 mg total) by mouth 3 (three) times daily. 03/07/19   Johnn Hai, MD  mirtazapine (REMERON) 15 MG tablet Take 1 tablet (15 mg total) by mouth at bedtime. 03/07/19   Johnn Hai, MD  traZODone (DESYREL) 50 MG tablet Take 50 mg by mouth at bedtime.    [provider]    Allergies    Tylenol [acetaminophen]  Review of Systems   Review of Systems  Constitutional: Negative for appetite change, chills and fever.  Respiratory: Negative for cough and shortness of breath.   Gastrointestinal: Negative for abdominal pain, nausea and vomiting.  Genitourinary: Negative for dysuria.  Neurological: Negative for dizziness.  Psychiatric/Behavioral: Positive for agitation. Negative for behavioral problems, confusion, decreased concentration, dysphoric mood, hallucinations, self-injury, sleep disturbance and suicidal ideas. The patient is nervous/anxious. The patient is not hyperactive.     Physical Exam Updated Vital Signs There were no vitals taken for this visit.  Physical Exam Vitals and nursing note reviewed.  Constitutional:      General: He is not in acute distress.    Appearance: Normal appearance. He is not ill-appearing, toxic-appearing or diaphoretic.     Comments: Patient is quietly eating meal when I enter the room  HENT:      Head: Normocephalic.     Mouth/Throat:     Mouth: Mucous membranes are moist.     Pharynx: Oropharynx is clear.  Eyes:     Conjunctiva/sclera: Conjunctivae normal.  Pulmonary:     Effort: Pulmonary effort is normal.  Skin:    General: Skin is dry.  Neurological:     Mental Status: He is alert.  Psychiatric:        Mood and Affect: Mood normal.        Behavior: Behavior normal.        Thought Content: Thought content normal.     Comments: cooperative     ED Results / Procedures / Treatments   Labs (all labs ordered are listed, but only abnormal results are displayed) Labs Reviewed  COMPREHENSIVE METABOLIC PANEL - Abnormal; Notable for the following components:      Result Value   Calcium 8.8 (*)    AST 14 (*)    All other components within normal limits  CBC WITH DIFFERENTIAL/PLATELET - Abnormal; Notable for the following components:   RBC 4.11 (*)    Hemoglobin 12.9 (*)    All other components within normal limits  RESPIRATORY PANEL BY RT PCR (FLU A&B, COVID)  ETHANOL  RAPID URINE DRUG SCREEN, HOSP PERFORMED  RAPID URINE DRUG SCREEN, HOSP PERFORMED    EKG None  Radiology No results found.  Procedures Procedures (including critical care time)  Medications Ordered in ED Medications - No data to display  ED Course  I have reviewed the triage vital signs and the nursing notes.  Pertinent labs & imaging results that were available during my care of the patient were reviewed by me and considered in my medical decision making (see chart for details).  Clinical Course as of Apr 18 1512  Tue Apr 19, 2019  1514 Medically cleared for psych. Patient cooperative. Under IVC for SI, HI, drug use.   [KM]    Clinical Course User Index [KM] Jeral Pinch   MDM Rules/Calculators/A&P                       Final Clinical Impression(s) / ED Diagnoses Final diagnoses:  Anxiety  Polysubstance abuse [facility])    Rx / DC Orders ED Discharge Orders    None        Jeral Pinch 04/19/19 1515    Donnetta Hutching, MD 04/22/19 1529
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
## 2019-04-19 NOTE — ED Triage Notes (Signed)
Per IVC paper work, states patient is abusing Xanax, Fentanyl and Heroin-states he is angry and  threatening to harm to othersf-states he has OD at least 8 times in the last 6 weeks-

## 2019-04-20 DIAGNOSIS — F1994 Other psychoactive substance use, unspecified with psychoactive substance-induced mood disorder: Secondary | ICD-10-CM

## 2019-04-20 DIAGNOSIS — R4689 Other symptoms and signs involving appearance and behavior: Secondary | ICD-10-CM

## 2019-04-20 NOTE — ED Notes (Signed)
Slept the entire shift. Mom called again to speak with him around 0500. Will give him message since he was asleep and unit rules is phones are not available until after 0900.

## 2019-04-20 NOTE — Consult Note (Signed)
[facility] Psych ED Discharge  04/20/2019 12:16 PM Arthur Martinez  MRN:  [id number] Principal Problem: Substance induced mood disorder [facility]) Discharge Diagnoses: Principal Problem:   Substance induced mood disorder (HCC) Active Problems:   Aggressive behavior   Attention deficit hyperactivity disorder (ADHD), combined type   MDD (major depressive disorder)   Polysubstance abuse (HCC)   Subjective: Arthur Martinez, 19 y.o., male patient seen via tele psych by this provider, Dr. Lucianne Muss; and chart reviewed on 04/20/19.  On evaluation Arthur Martinez reports "I got upset yesterday and my mama think she can just take out a involuntary commitment and put me in the hospital.  I am a grown man and I don't need to come to the hospital.  I got upset yesterday because I just found out that the child my girlfriend is pregnant with might not be mine.  I had a right to be upset; but I didn't want to kill myself.  I said I was going to be there for the child even if it ain't mine."  At this time patient denies suicidal/self-harm/homicidal ideation, psychosis, and paranoia.  Patient states that he is not taking any psychotropics at this time "because I don't need them.  I don't need a psychiatrist."  Patient states that he is unemployed but has an interview with prospective job today but unsure of what time he is suppose to be there.  "I don't have my phone so I don't know what time."  Patient gave permission to speak to his mother or father for collateral information.   During evaluation [name] Kiwan G[place]den is alert/oriented x 4; calm/cooperative; somewhat irritable about being in hospital and under involuntary comment.  His mood is congruent with affect.  He does not appear to be responding to internal/external stimuli or delusional thoughts.  Patient denies suicidal/self-harm/homicidal ideation, psychosis, and paranoia.  Patient answered question appropriately.    Collateral Information:   Spoke with patients'  mother Thomas Hoff [petitioner of IVC] at [phone]).  Harriett Sine states that her primary concern with her son (patient) is his drug use.  "I don't even know all of the drugs he is using.  Heroin and Fentanyl is one of them and I've had to use Narcan and CPR on him before.  I just want him to get help for his drug use."  He has been depressed since the death of his brother last 12-Feb-2023 and he has a anger problem.  I know he is going to be mad if he found out that I took out that IVC and I catch a lot a shit from him as it is.  He was in court yesterday and lost it;the lawyer called me and said I could take out a IVC for him to go to the hospital or he was going to jail and I know when they go to jail they don't help with withdrawal so I thought it would be better for me to take out the IVC and he come to the hospital so he could get help."  Patients' mother states that patient lives with her and his father.  States that he has had psychiatric hospitalization at [facility] last year and "that was the best I ever seen him, but he didn't take his medication and was suppose to follow up with [facility] but didn't go there either."  Mother states that patient can come home and she can pick up from hospital.  "It is fine for him to come home, I  don't have a problem with that.  I just want him to get help for his drug problem.  I've lost one son, I don't want to use another one."  Discussed different substance use programs outpatient, short/long term rehab facilities.  Informed that resources would be given at discharge if she wanted to help son find a rehab facility to go to.  Also discussed [facility] [facility]) and would send information.    Total Time spent with patient: 45 minutes  Past Psychiatric History: See above  Past Medical History:  Past Medical History:  Diagnosis Date  . Bipolar 1 disorder (HCC)   . Hypertension     Past Surgical History:  Procedure Laterality Date  .  CIRCUMCISION     Family History:  Family History  Problem Relation Age of Onset  . Heart attack Maternal Grandfather   . Bipolar disorder Maternal Grandfather   . Schizophrenia Maternal Grandfather   . Stroke Paternal Grandfather   . Depression Mother   . Anxiety disorder Mother   . ADD / ADHD Brother   . Depression Maternal Grandmother   . Anxiety disorder Maternal Grandmother   . Migraines Other    Family Psychiatric  History: See above Social History:  Social History   Substance and Sexual Activity  Alcohol Use Yes  . Alcohol/week: 2.0 standard drinks  . Types: 2 Cans of beer per week   Comment: occ     Social History   Substance and Sexual Activity  Drug Use Yes  . Frequency: 7.0 times per week  . Types: Marijuana, Heroin, Amphetamines, Benzodiazepines   Comment: daily use    Social History   Socioeconomic History  . Marital status: Single    Spouse name: Not on file  . Number of children: Not on file  . Years of education: Not on file  . Highest education level: Not on file  Occupational History  . Occupation: Consulting civil engineer  Tobacco Use  . Smoking status: Current Every Day Smoker    Packs/day: 1.00    Years: 7.00    Pack years: 7.00    Types: Cigarettes  . Smokeless tobacco: Never Used  . Tobacco comment: Tried smoking and dipping once  Substance and Sexual Activity  . Alcohol use: Yes    Alcohol/week: 2.0 standard drinks    Types: 2 Cans of beer per week    Comment: occ  . Drug use: Yes    Frequency: 7.0 times per week    Types: Marijuana, Heroin, Amphetamines, Benzodiazepines    Comment: daily use  . Sexual activity: Yes    Birth control/protection: Condom  Other Topics Concern  . Not on file  Social History Narrative   Pt lives in [place] with father Declan, M[place].. [phone]).  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   Social Determinants of Health   Financial Resource Strain:    . Difficulty of Paying Living Expenses: Not on file  Food Insecurity:   . Worried About Programme researcher, broadcasting/film/video in the Last Year: Not on file  . Ran Out of Food in the Last Year: Not on file  Transportation Needs:   . Lack of Transportation (Medical): Not on file  . Lack of Transportation (Non-Medical): Not on file  Physical Activity:   . Days of Exercise per Week: Not on file  . Minutes of Exercise per Session: Not on file  Stress:   .  Feeling of Stress : Not on file  Social Connections:   . Frequency of Communication with Friends and Family: Not on file  . Frequency of Social Gatherings with Friends and Family: Not on file  . Attends Religious Services: Not on file  . Active Member of Clubs or Organizations: Not on file  . Attends Archivist Meetings: Not on file  . Marital Status: Not on file    Has this patient used any form of tobacco in the last 30 days? (Cigarettes, Smokeless Tobacco, Cigars, and/or Pipes) A prescription for an FDA-approved tobacco cessation medication was offered at discharge and the patient refused  Current Medications: No current facility-administered medications for this encounter.   Current Outpatient Medications  Medication Sig Dispense Refill  . Buprenorphine HCl-Naloxone HCl 4-1 MG FILM Place 1 Film under the tongue in the morning and at bedtime.    . divalproex (DEPAKOTE ER) 250 MG 24 hr tablet Take 250-500 mg by mouth See admin instructions. Take 500 mg every morning and 250 mg every night at bedtime.    . gabapentin (NEURONTIN) 300 MG capsule Take 1 capsule (300 mg total) by mouth 3 (three) times daily. 90 capsule 1  . HYDROXYZINE HCL PO Take by mouth in the morning and at bedtime.    . mirtazapine (REMERON) 15 MG tablet Take 1 tablet (15 mg total) by mouth at bedtime. 90 tablet 1  . traZODone (DESYREL) 50 MG tablet Take 50 mg by mouth at bedtime.     PTA Medications: (Not in a hospital admission)   Musculoskeletal: Strength & Muscle  Tone: within normal limits Gait & Station: normal Patient leans: N/A  Psychiatric Specialty Exam: Physical Exam Vitals and nursing note reviewed. Exam conducted with a chaperone present (Sitter at bedside).  Constitutional:      Appearance: Normal appearance. He is normal weight.  Pulmonary:     Effort: Pulmonary effort is normal.  Neurological:     Mental Status: He is alert.  Psychiatric:        Attention and Perception: Attention and perception normal.        Mood and Affect: Affect is angry.        Speech: Speech normal.        Behavior: Behavior is agitated. Behavior is cooperative.        Thought Content: Thought content is not paranoid or delusional. Thought content does not include homicidal or suicidal ideation.        Cognition and Memory: Cognition and memory normal.        Judgment: Judgment is impulsive.     Review of Systems  Gastrointestinal: Negative for diarrhea, nausea and vomiting.  Neurological: Negative for headaches.  Psychiatric/Behavioral: Positive for agitation (Related to being in hospital). Behavioral problem: Denies. Confusion: Denies. Hallucinations: Denies. Self-injury: Denies. Sleep disturbance: Denies. Suicidal ideas: Denies. Nervous/anxious: Denies.        Patient states he does have stress and some depression related the murder of his brother a year ago. But not suicidal.  Patient admits to drug use but will not discuss the drugs he is using  All other systems reviewed and are negative.   Blood pressure 126/74, pulse (!) 54, temperature 97.9 F (36.6 C), temperature source Oral, resp. rate 19, SpO2 98 %.There is no height or weight on file to calculate BMI.  General Appearance: Casual  Eye Contact:  Good  Speech:  Clear and Coherent and Normal Rate  Volume:  Normal  Mood:  Irritable  Affect:  Appropriate and Congruent  Thought Process:  Coherent, Goal Directed and Descriptions of Associations: Intact  Orientation:  Full (Time, Place, and  Person)  Thought Content:  WDL  Suicidal Thoughts:  No  Homicidal Thoughts:  No  Memory:  Immediate;   Good Recent;   Good  Judgement:  Intact  Insight:  Present  Psychomotor Activity:  Normal  Concentration:  Concentration: Good and Attention Span: Good  Recall:  Good  Fund of Knowledge:  Fair  Language:  Good  Akathisia:  No  Handed:  Right  AIMS (if indicated):     Assets:  Communication Skills Desire for Improvement Housing Social Support  ADL's:  Intact  Cognition:  WNL  Sleep:        Demographic Factors:  Male and Unemployed  Loss Factors: Death of brother 1 year ago  Historical Factors: NA  Risk Reduction Factors:   Sense of responsibility to family, Religious beliefs about death and Living with another person, especially a relative  Continued Clinical Symptoms:  Alcohol/Substance Abuse/Dependencies Previous Psychiatric Diagnoses and Treatments  Cognitive Features That Contribute To Risk:  None    Suicide Risk:  Minimal: No identifiable suicidal ideation.  Patients presenting with no risk factors but with morbid ruminations; may be classified as minimal risk based on the severity of the depressive symptoms  Follow-up Information    [facility]. Call.   Why: Call Mon - Friday for 8am to 4pm to set up an appointment for outpatient psychiatric services and medication management.   Contact information: [address] [place] [address] [phone]        [facility] Follow up.   Specialty: Professional Counselor Why: Walk in or call Monday -Thursday from 8 am to 4 pm to set up an appointment for psychiatric services and medication management.  Also assist with substance use disorders Contact information: [facility] [place] [address] [place] [address] [phone]        Services, [facility] .   Specialty: Pension scheme manager information: [address] [place] [address] [phone]           Plan Of Care/Follow-up recommendations:  Activity:  As tolerated Diet:  Heart healthy   Follow up with resources given:  [facility]) below, outpatient psychiatric service above under follow up  Discharge Instructions     Visit this website for more information:  Victory-Program-Brochure-Sm.pdf ([email or web address]) The [employer] is the [employer] 30-month recovery program designed to provide holistic restoration for addicted men and women. Through addiction counseling and other related programs, participants will be able to return to the workforce and become a contributing member of society once again. Learn more about our addiction recovery program in the [place] area ? The [employer] Program: Addiction Recovery In [employer] The [employer] is the [employer] 69-month holistic program designed to help people overcome a lifestyle of addictions and return to the workforce investing back into the community. Helping others overcome addictions has always been a priority for the ministry of the [employer]. Dr. Lendon Ka' personal experience with the alcohol addiction of his father led him to [facility] in 1973 with a hope of reaching the addicted. Today, the [employer] helps restore the lives of dozens of men and women throughout the [place] suffering from alcohol addiction and drug addiction. VIEW VICTORY PROGRAM BROCHURE Program Steps PHASE 1: LEARNING A BETTER WAY  The first phase of the [facility] is a six-month period where residents receive in-depth instruction in areas critical to building a solid foundation of lasting success as they overcome addictions, including: . Addiction Education . Family Health . Christian Discipleship . Spiritual Disciplines . Healthy Relationships . Personal Finance . Work Administrator, Civil Service . Software engineer . Career Planning All classes are geared toward the message of hope through Christ and the reshaping of the paradigm of how to handle the pressures and pains of life. PHASE 2: ACCOUNTABILITY THROUGH EMPLOYMENT During this second phase of addiction recovery, residents learn skills and trades they can incorporate into future careers, such as: Marland Kitchen Employment . Accountability . Biblical Counseling . Transition . Personal Finance . Legal Aid In the words of Lendon Ka: "In the first six months, we teach the residents how to live. In the second six months, we teach them how to make a living." PHASE 3: APPRENTICESHIP & TRANSITION In this final stage, men and women in the [facility] will continue their sobriety in a supportive environment -- all while establishing a solid employment through apprenticeship in one of the [employer] departments: . Men's Division . Women's Division . Encouragement . Thrift Stores . Day Care . Advertising account planner may choose to attend school or receive vocational training, with financial assistance provided through the [employer] and the [employer]. During this time, residents continue to live in one of our men's or women's shelters in the [place] area while they work to obtain permanent employment, save needed start-up funds, seek affordable housing, and continue educational and vocational classes.   Disposition:  Patient psychiatrically cleared No evidence of imminent risk to self or others at present.   Patient does not meet criteria for psychiatric inpatient admission. Supportive therapy provided about ongoing stressors. Discussed crisis plan, support from social network, calling 911, coming to the Emergency Department, and calling Suicide Hotline.   Conor Filsaime, NP 04/20/2019, 12:16 PM

## 2019-04-20 NOTE — Discharge Instructions (Signed)
Visit this website for more information:  Victory-Program-Brochure-Sm.pdf ([email or web address]) The [employer] is the [employer] 33-month recovery program designed to provide holistic restoration for addicted men and women. Through addiction counseling and other related programs, participants will be able to return to the workforce and become a contributing member of society once again. Learn more about our addiction recovery program in the [place] area ? The [employer] Program: Addiction Recovery In [employer] The [employer] is the [employer] 46-month holistic program designed to help people overcome a lifestyle of addictions and return to the workforce investing back into the community. Helping others overcome addictions has always been a priority for the ministry of the [employer]. Dr. Lendon Ka' personal experience with the alcohol addiction of his father led him to [facility] in [facility] with a hope of reaching the addicted. Today, the [employer] helps restore the lives of dozens of men and women throughout the [place] suffering from alcohol addiction and drug addiction. VIEW VICTORY PROGRAM BROCHURE Program Steps PHASE 1: LEARNING A BETTER WAY The first phase of the [employer] is a six-month period where residents receive in-depth instruction in areas critical to building a solid foundation of lasting success as they overcome addictions, including: . Addiction Education . Family Health . Christian Discipleship . Spiritual Disciplines . Healthy Relationships . Personal Finance . Work Administrator, Civil Service . Equities trader . Career Planning All classes are geared toward the message of hope through Christ and the reshaping of the paradigm of how to handle the pressures and pains of life. PHASE 2: ACCOUNTABILITY THROUGH EMPLOYMENT During this second phase of addiction recovery, residents learn skills and trades they can  incorporate into future careers, such as: Marland Kitchen Employment . Accountability . Biblical Counseling . Transition . Personal Finance . Legal Aid In the words of Lendon Ka: "In the first six months, we teach the residents how to live. In the second six months, we teach them how to make a living." PHASE 3: APPRENTICESHIP & TRANSITION In this final stage, men and women in the [facility] will continue their sobriety in a supportive environment -- all while establishing a solid employment through apprenticeship in one of the [employer] departments: . Men's Division . Women's Division . Encouragement . Thrift Stores . Day Care . Advertising account planner may choose to attend school or receive vocational training, with financial assistance provided through the [employer] and the [employer]. During this time, residents continue to live in one of our men's or women's shelters in the [place] area while they work to obtain permanent employment, save needed start-up funds, seek affordable housing, and continue educational and vocational classes.  ______________________________________________________________________________________________  FOR YOUR BEHAVIORAL HEALTH NEEDS:  You are advised to follow up with [facility].  They offer both mental health and substance abuse treatment       [facility] [place]      [address]      [place], [place] [address]      [phone]      New patients are seen at their walk-in clinic.  Walk-in hours are Monday - Friday from 8:30 am - 12:00 pm, and from 1:00 pm - 2:30 pm.  Walk-in patients are seen on a first come, first served basis, so try to arrive as early as possible for the best chance of being seen the same day.

## 2019-04-20 NOTE — BH Assessment (Signed)
[facility] Assessment Progress Note  Per Nelly Rout, MD, this pt does not require psychiatric hospitalization at this time.  Pt presents under IVC initiated by pt's mother, which Dr Lucianne Muss has rescinded.  Pt is to be discharged from [facility] with recommendation to follow up with [facility] [place].  This has been included in pt's discharge instructions.  Pt's nurse, Addison Naegeli, has been notified.  Doylene Canning, MA Triage Specialist [phone]

## 2019-04-20 NOTE — Patient Outreach (Signed)
ED Peer Support Specialist Patient Intake (Complete at intake & 30-60 Day Follow-up)  Name: Arthur Martinez  MRN: [id number]  Age: 19 y.o.   Date of Admission: 04/20/2019  Intake: Initial Comments:      Primary Reason Admitted:  patient is abusing Xanax, Fentanyl and Heroin-states he is angry and  threatening to harm to othersf-states he has OD at least 8 times in the last 6 weeks  Lab values: Alcohol/ETOH: Not completed Positive UDS? Yes Amphetamines: Yes Barbiturates: Yes Benzodiazepines: Yes Cocaine: No Opiates: Yes Cannabinoids: Yes  Demographic information: Gender: Male Ethnicity: African American Marital Status: Single Insurance Status: Medicaid Ecologist (Work Neurosurgeon, Physicist, medical, etc.: Yes(Food Public librarian) Lives with: Partner/Spouse Living situation: House/Apartment  Reported Patient History: Patient reported health conditions: Bipolar disorder, ADD/ADHD, Anxiety disorders Patient aware of HIV and hepatitis status: No  In past year, has patient visited ED for any reason? Yes  Number of ED visits: 2  Reason(s) for visit: Same situations  In past year, has patient been hospitalized for any reason? No  Number of hospitalizations:    Reason(s) for hospitalization:    In past year, has patient been arrested? Yes  Number of arrests:    Reason(s) for arrest: N/A  In past year, has patient been incarcerated? No  Number of incarcerations:    Reason(s) for incarceration:    In past year, has patient received medication-assisted treatment? No  In past year, patient received the following treatments:    In past year, has patient received any harm reduction services? No  Did this include any of the following?    In past year, has patient received care from a mental health provider for diagnosis other than SUD? No  In past year, is this first time patient has overdosed? No  Number of past overdoses:    In past year, is  this first time patient has been hospitalized for an overdose? No  Number of hospitalizations for overdose(s):    Is patient currently receiving treatment for a mental health diagnosis? No  Patient reports experiencing difficulty participating in SUD treatment: No    Most important reason(s) for this difficulty?    Has patient received prior services for treatment? No  In past, patient has received services from following agencies:    Plan of Care:  Suggested follow up at these agencies/treatment centers: Other (comment)(Follow up with Pt in Community)  Other information: CPSS met with Pt an was able to complete series of questions. CPSS was made aware that Pt was admitted because of the fact that he may not be one of his children's father. CPSS processed with Pt and praised Pt for stating that he is still going to be apart of the child's life regardless. CPSS also was able to gain information about the substance abuse, which Pt stated he feels that he does need help. Pt made CPSS aware that he wants resource list to follow up in the community on his own. CPSS left contact information for Pt if he needs assistance in community.    Aaron Edelman Mandrell Vangilder, CPSS  04/20/2019 11:57 AM

## 2019-04-20 NOTE — ED Provider Notes (Signed)
Vitals:   04/19/19 1541 04/20/19 0632  BP: 126/67 126/74  Pulse: 77 (!) 54  Resp: 18 19  Temp: 97.8 F (36.6 C) 97.9 F (36.6 C)  SpO2: 97% 98%   Pt sleeping this am.  Labs reviewed.  UDS with multiple substances. Plan is for reassessment today  by psychiatry.   Linwood Dibbles, MD 04/20/19 [phone]

## 2019-04-20 NOTE — ED Notes (Signed)
Patient has been asleep all shift. Mom called earlier in the shift but not able to speak with him as he is asleep. Message taken to tell him she called once he wakes up.

## 2019-04-29 ENCOUNTER — Other Ambulatory Visit: Payer: Self-pay

## 2019-04-29 ENCOUNTER — Emergency Department (HOSPITAL_COMMUNITY)
Admission: EM | Admit: 2019-04-29 | Discharge: 2019-04-29 | Disposition: A | Discharge status: Home / Self Care | Payer: Medicaid Other | Attending: Emergency Medicine | Admitting: Emergency Medicine

## 2019-04-29 ENCOUNTER — Encounter (HOSPITAL_COMMUNITY): Payer: Self-pay

## 2019-04-29 DIAGNOSIS — F17210 Nicotine dependence, cigarettes, uncomplicated: Secondary | ICD-10-CM | POA: Diagnosis not present

## 2019-04-29 DIAGNOSIS — Z79899 Other long term (current) drug therapy: Secondary | ICD-10-CM | POA: Diagnosis not present

## 2019-04-29 DIAGNOSIS — F19188 Other psychoactive substance abuse with other psychoactive substance-induced disorder: Secondary | ICD-10-CM | POA: Diagnosis not present

## 2019-04-29 DIAGNOSIS — Z20822 Contact with and (suspected) exposure to covid-19: Secondary | ICD-10-CM | POA: Insufficient documentation

## 2019-04-29 DIAGNOSIS — I10 Essential (primary) hypertension: Secondary | ICD-10-CM | POA: Diagnosis not present

## 2019-04-29 DIAGNOSIS — F1910 Other psychoactive substance abuse, uncomplicated: Secondary | ICD-10-CM | POA: Diagnosis present

## 2019-04-29 DIAGNOSIS — T50904A Poisoning by unspecified drugs, medicaments and biological substances, undetermined, initial encounter: Secondary | ICD-10-CM | POA: Diagnosis present

## 2019-04-29 DIAGNOSIS — T40604A Poisoning by unspecified narcotics, undetermined, initial encounter: Secondary | ICD-10-CM

## 2019-04-29 DIAGNOSIS — T402X4A Poisoning by other opioids, undetermined, initial encounter: Secondary | ICD-10-CM | POA: Diagnosis not present

## 2019-04-29 LAB — RESPIRATORY PANEL BY RT PCR (FLU A&B, COVID)
Influenza A by PCR: NEGATIVE
Influenza B by PCR: NEGATIVE
SARS Coronavirus 2 by RT PCR: NEGATIVE

## 2019-04-29 LAB — CBG MONITORING, ED: Glucose-Capillary: 98 mg/dL (ref 70–99)

## 2019-04-29 LAB — COMPREHENSIVE METABOLIC PANEL
ALT: 16 U/L (ref 0–44)
AST: 19 U/L (ref 15–41)
Albumin: 3.7 g/dL (ref 3.5–5.0)
Alkaline Phosphatase: 75 U/L (ref 38–126)
Anion gap: 8 (ref 5–15)
BUN: 6 mg/dL (ref 6–20)
CO2: 26 mmol/L (ref 22–32)
Calcium: 8.6 mg/dL — ABNORMAL LOW (ref 8.9–10.3)
Chloride: 107 mmol/L (ref 98–111)
Creatinine, Ser: 0.97 mg/dL (ref 0.61–1.24)
GFR calc Af Amer: >60 mL/min (ref >60)
GFR calc non Af Amer: >60 mL/min (ref >60)
Glucose, Bld: 102 mg/dL — ABNORMAL HIGH (ref 70–99)
Potassium: 3.7 mmol/L (ref 3.5–5.1)
Sodium: 141 mmol/L (ref 135–145)
Total Bilirubin: 0.2 mg/dL — ABNORMAL LOW (ref 0.3–1.2)
Total Protein: 7.1 g/dL (ref 6.5–8.1)

## 2019-04-29 LAB — CBC WITH DIFFERENTIAL/PLATELET
Abs Immature Granulocytes: 0.01 10*3/uL (ref 0.00–0.07)
Basophils Absolute: 0 10*3/uL (ref 0.0–0.1)
Basophils Relative: 0 %
Eosinophils Absolute: 0.2 10*3/uL (ref 0.0–0.5)
Eosinophils Relative: 2 %
HCT: 39.4 % (ref 39.0–52.0)
Hemoglobin: 12.7 g/dL — ABNORMAL LOW (ref 13.0–17.0)
Immature Granulocytes: 0 %
Lymphocytes Relative: 39 %
Lymphs Abs: 2.6 10*3/uL (ref 0.7–4.0)
MCH: 30.8 pg (ref 26.0–34.0)
MCHC: 32.2 g/dL (ref 30.0–36.0)
MCV: 95.4 fL (ref 80.0–100.0)
Monocytes Absolute: 0.5 10*3/uL (ref 0.1–1.0)
Monocytes Relative: 7 %
Neutro Abs: 3.5 10*3/uL (ref 1.7–7.7)
Neutrophils Relative %: 52 %
Platelets: 225 10*3/uL (ref 150–400)
RBC: 4.13 MIL/uL — ABNORMAL LOW (ref 4.22–5.81)
RDW: 12.4 % (ref 11.5–15.5)
WBC: 6.8 10*3/uL (ref 4.0–10.5)
nRBC: 0 % (ref 0.0–0.2)

## 2019-04-29 LAB — ETHANOL: Alcohol, Ethyl (B): <10 mg/dL (ref <10)

## 2019-04-29 MED ORDER — NALOXONE HCL 2 MG/2ML IJ SOSY
PREFILLED_SYRINGE | INTRAMUSCULAR | Status: AC
  Filled 2019-04-29: qty 2

## 2019-04-29 MED ORDER — NALOXONE HCL 0.4 MG/ML IJ SOLN
INTRAMUSCULAR | Status: AC
  Filled 2019-04-29: qty 1

## 2019-04-29 MED ORDER — ONDANSETRON HCL 4 MG/2ML IJ SOLN
INTRAMUSCULAR | Status: AC
  Filled 2019-04-29: qty 2

## 2019-04-29 NOTE — ED Notes (Signed)
Patient on phone with mother at this time 

## 2019-04-29 NOTE — ED Triage Notes (Signed)
Sitting inside of car outside of house. Took unknown substance. Came inside house and became unresponsive. Became agitated enroute and presents in restraints with 5mg  Haldol IM

## 2019-04-29 NOTE — Patient Outreach (Signed)
CPSS met with the patient in order to provide substance use recovery support and provide information for substance use recovery resources. Patient reports a history of poly substance use with alcohol use, cannabis use, and opiate use. Patient plans to follow up with his suboxone treatment provider which the patient states his suboxone maintenance has helped with opiate related cravings. CPSS also provided the patient with information for several different substance use recovery resources including residential/outpatient substance use treatment center list, detox treatment center list, online/in-person [facility] NA meeting list, and CPSS contact information. CPSS strongly encouraged the patient to follow up with CPSS if needed for further help with getting connected to a specific substance use recovery resource or any further substance use recovery support with lived recovery experience from [place].

## 2019-04-29 NOTE — BH Assessment (Signed)
BHH Assessment Progress Note  Per [name], FNP, this pt does not require psychiatric hospitalization at this time.  Pt presents under IVC initiated by pt's mother and upheld by EDP Drema Pry, MD, which has been rescinded by Nelly Rout, MD.  Pt is to be discharged from [facility] with recommendation to follow up with [facility].  This has been included in pt's discharge instructions.  Pt would also benefit from seeing Peer Support Specialists, and a peer support consult has been ordered for pt.  Pt's nurse has been notified.  Doylene Canning, MA Triage Specialist [phone]

## 2019-04-29 NOTE — Discharge Instructions (Signed)
For your behavioral health needs you are advised to follow up with [facility].  New patients are seen at their walk-in clinic.  Walk-in hours are Monday - Friday from 8:30 am - 12:00 pm, and from 1:00 pm - 2:30 pm.  Walk-in patients are seen on a first come, first served basis, so try to arrive as early as possible for the best chance of being seen the same day:       [facility]      [address]      [place], [place] [address]      [phone] 

## 2019-04-29 NOTE — ED Notes (Signed)
Patient provided with meal tray.

## 2019-04-29 NOTE — BHH Suicide Risk Assessment (Cosign Needed)
Suicide Risk Assessment  Discharge Assessment   [facility] Discharge Suicide Risk Assessment   Principal Problem: Polysubstance abuse [facility]) Discharge Diagnoses: Principal Problem:   Polysubstance abuse (HCC)   Total Time spent with patient: 30 minutes  Musculoskeletal: Strength & Muscle Tone: within normal limits Gait & Station: normal Patient leans: N/A  Psychiatric Specialty Exam:   Blood pressure (!) 100/59, pulse (!) 52, temperature 97.8 F (36.6 C), temperature source Oral, resp. rate 11, height 5\' 8"  (1.727 m), weight 77.1 kg, SpO2 100 %.Body mass index is 25.85 kg/m.  General Appearance: Casual  Eye Contact::  Fair  Speech:  Clear and Coherent and Normal Rate  Volume:  Normal  Mood:  Euthymic  Affect:  Congruent  Thought Process:  Coherent, Goal Directed and Descriptions of Associations: Intact  Orientation:  Full (Time, Place, and Person)  Thought Content:  Logical  Suicidal Thoughts:  No  Homicidal Thoughts:  No  Memory:  Immediate;   Good Recent;   Good Remote;   Good  Judgement:  Fair  Insight:  Fair  Psychomotor Activity:  Normal  Concentration:  Good  Recall:  Good  Fund of Knowledge:Good  Language: Good  Akathisia:  No  Handed:  Right  AIMS (if indicated):     Assets:  Communication Skills Desire for Improvement Financial Resources/Insurance Housing Intimacy Leisure Time Physical Health Resilience Social Support Talents/Skills Transportation  Sleep:     Cognition: WNL  ADL's:  Intact   Mental Status Per Nursing Assessment::   On Admission:   Patient assessed by nurse practitioner along with Dr. [phone].  Patient alert and oriented, answers appropriately.  Patient verbalizes readiness to discharge home. Patient states "I am about to be 19, I am not trying to run my life."  Patient denies suicidal and homicidal ideations.  Patient denies access to weapons.  Patient denies history of self-harm.  Patient denies auditory and visual  hallucinations.    Demographic Factors:  Male  Loss Factors: Financial problems/change in socioeconomic status  Historical Factors: NA  Risk Reduction Factors:   Living with another person, especially a relative, Positive social support, Positive therapeutic relationship and Positive coping skills or problem solving skills  Continued Clinical Symptoms:  Alcohol/Substance Abuse/Dependencies  Cognitive Features That Contribute To Risk:  None    Suicide Risk:  Minimal: No identifiable suicidal ideation.  Patients presenting with no risk factors but with morbid ruminations; may be classified as minimal risk based on the severity of the depressive symptoms    Plan Of Care/Follow-up recommendations:  Other:  Follow-up with outpatient psychiatry.  Patient reports concern for substance use disorder, peers support consult ordered and completed.  Follow-up with outpatient substance use resources.  Lucianne Muss, FNP 04/29/2019, 12:06 PM

## 2019-04-29 NOTE — ED Notes (Signed)
Patient resting with eyes closed. Equal chest rise and fall noted. Sitter remains at bedside.

## 2019-04-29 NOTE — ED Notes (Signed)
Patient's mother called. Call transferred to patient room.

## 2019-04-29 NOTE — BH Assessment (Signed)
BHH Assessment Progress Note This writer attempted to contact patient's mother Thomas Hoff [phone] this date to gather collateral information unsuccessfully.

## 2019-04-29 NOTE — ED Provider Notes (Signed)
[facility]-EMERGENCY DEPT Provider Note  CSN: [id number] Arrival date & time: 04/29/19 0033  Chief Complaint(s) IVC; Drug Overdose (unknown sub. IVC taken out by parents. )  HPI Arthur Martinez is a 19 y.o. male with a past medical history listed below including polysubstance abuse who presents to the emergency department with likely drug overdose.  EMS was called out to the patient's home by his mother after he became unresponsive.  Mother reports that the patient had just come in after sitting up in the car with a friend.  When EMS arrived and patient was awaken, he became agitated requiring 5 mg of Haldol.  Patient remained hemodynamically stable in route.  Remainder of history, ROS, and physical exam limited due to patient's condition (unresponsiveness). Additional information was obtained from EMS.   Level V Caveat.  Known opiate and benzo use disorder.  HPI  Past Medical History Past Medical History:  Diagnosis Date  . Bipolar 1 disorder (HCC)   . Hypertension    Patient Active Problem List   Diagnosis Date Noted  . Substance induced mood disorder (HCC) 04/20/2019  . Polysubstance abuse (HCC) 10/04/2018  . MDD (major depressive disorder) 08/30/2018  . Severe major depression, single episode, without psychotic features (HCC) 06/22/2018  . Tension headache 03/08/2014  . Anxiety state 03/08/2014  . Insomnia 03/08/2014  . Aggressive behavior 03/08/2014  . Attention deficit hyperactivity disorder (ADHD), combined type 03/08/2014   Home Medication(s) Prior to Admission medications   Medication Sig Start Date End Date Taking? Authorizing Provider  Buprenorphine HCl-Naloxone HCl 4-1 MG FILM Place 1 Film under the tongue in the morning and at bedtime.   Yes [provider]  divalproex (DEPAKOTE ER) 250 MG 24 hr tablet Take 250-500 mg by mouth See admin instructions. Take 500 mg every morning and 250 mg every night at bedtime.   Yes [provider]  gabapentin (NEURONTIN) 300 MG capsule Take 1 capsule (300 mg total) by mouth 3 (three) times daily. 03/07/19  Yes Malvin Johns, MD  mirtazapine (REMERON) 15 MG tablet Take 1 tablet (15 mg total) by mouth at bedtime. 03/07/19  Yes Malvin Johns, MD  traZODone (DESYREL) 50 MG tablet Take 50 mg by mouth at bedtime as needed for sleep.    Yes [provider]                                                                                                                                    Past Surgical History Past Surgical History:  Procedure Laterality Date  . CIRCUMCISION     Family History Family History  Problem Relation Age of Onset  . Heart attack Maternal Grandfather   . Bipolar disorder Maternal Grandfather   . Schizophrenia Maternal Grandfather   . Stroke Paternal Grandfather   . Depression Mother   . Anxiety disorder Mother   . ADD / ADHD Brother   .  Depression Maternal Grandmother   . Anxiety disorder Maternal Grandmother   . Migraines Other     Social History Social History   Tobacco Use  . Smoking status: Current Every Day Smoker    Packs/day: 1.00    Years: 7.00    Pack years: 7.00    Types: Cigarettes  . Smokeless tobacco: Never Used  . Tobacco comment: Tried smoking and dipping once  Substance Use Topics  . Alcohol use: Yes    Alcohol/week: 2.0 standard drinks    Types: 2 Cans of beer per week    Comment: occ  . Drug use: Yes    Frequency: 7.0 times per week    Types: Marijuana, Heroin, Amphetamines, Benzodiazepines    Comment: daily use   Allergies Tylenol [acetaminophen]  Review of Systems Review of Systems  Unable to perform ROS: Patient unresponsive    Physical Exam Vital Signs  I have reviewed the triage vital signs BP 129/64 (BP Location: Left Arm)   Pulse 76   Temp 97.6 F (36.4 C) (Oral)   Resp 15   Wt 76.7 kg   SpO2 95%   BMI 26.48 kg/m   Physical Exam Vitals reviewed.  Constitutional:      General: He is  not in acute distress.    Appearance: He is well-developed. He is not diaphoretic.  HENT:     Head: Normocephalic and atraumatic.     Nose: Nose normal.     Mouth/Throat:     Comments: Poor dentition.  Eyes:     General: No scleral icterus.       Right eye: No discharge.        Left eye: No discharge.     Conjunctiva/sclera: Conjunctivae normal.     Pupils: Pupils are equal, round, and reactive to light.     Comments:  Pinpoint pupils  Cardiovascular:     Rate and Rhythm: Normal rate and regular rhythm.     Heart sounds: No murmur. No friction rub. No gallop.   Pulmonary:     Effort: Pulmonary effort is normal. No respiratory distress.     Breath sounds: Normal breath sounds. No stridor. No rales.  Abdominal:     General: There is no distension.     Palpations: Abdomen is soft.     Tenderness: There is no abdominal tenderness.  Musculoskeletal:        General: No tenderness.     Cervical back: Normal range of motion and neck supple.  Skin:    General: Skin is warm and dry.     Findings: No erythema or rash.  Neurological:     Mental Status: He is unresponsive.     ED Results and Treatments Labs (all labs ordered are listed, but only abnormal results are displayed) Labs Reviewed  COMPREHENSIVE METABOLIC PANEL - Abnormal; Notable for the following components:      Result Value   Glucose, Bld 102 (*)    Calcium 8.6 (*)    Total Bilirubin 0.2 (*)    All other components within normal limits  CBC WITH DIFFERENTIAL/PLATELET - Abnormal; Notable for the following components:   RBC 4.13 (*)    Hemoglobin 12.7 (*)    All other components within normal limits  RESPIRATORY PANEL BY RT PCR (FLU A&B, COVID)  ETHANOL  RAPID URINE DRUG SCREEN, HOSP PERFORMED  CBG MONITORING, ED  EKG  EKG Interpretation  Date/Time:  Friday April 29 2019 05:00:20  EST Ventricular Rate:  67 PR Interval:    QRS Duration: 105 QT Interval:  402 QTC Calculation: 425 R Axis:   63 Text Interpretation: Sinus arrhythmia ST elev, probable normal early repol pattern Baseline wander in lead(s) V2 V4 V5 V6 No acute changes Confirmed by Addison Lank [id number]) on 04/29/2019 5:40:29 AM      Radiology No results found.  Pertinent labs & imaging results that were available during my care of the patient were reviewed by me and considered in my medical decision making (see chart for details).  Medications Ordered in ED Medications  naloxone (NARCAN) 0.4 MG/ML injection (  Given 04/29/19 0049)  ondansetron (ZOFRAN) 4 MG/2ML injection (  Given 04/29/19 0115)                                                                                                                                    Procedures .Critical Care Performed by: Fatima Blank, MD Authorized by: Fatima Blank, MD     CRITICAL CARE Performed by: Grayce Sessions Jozey Janco Total critical care time: 55 minutes Critical care time was exclusive of separately billable procedures and treating other patients. Critical care was necessary to treat or prevent imminent or life-threatening deterioration. Critical care was time spent personally by me on the following activities: development of treatment plan with patient and/or surrogate as well as nursing, discussions with consultants, evaluation of patient's response to treatment, examination of patient, obtaining history from patient or surrogate, ordering and performing treatments and interventions, ordering and review of laboratory studies, ordering and review of radiographic studies, pulse oximetry and re-evaluation of patient's condition.   (including critical care time)  Medical Decision Making / ED Course I have reviewed the nursing notes for this encounter and the patient's prior records (if available in EHR or on provided  paperwork).   Arthur Martinez was evaluated in Emergency Department on 04/29/2019 for the symptoms described in the history of present illness. He was evaluated in the context of the global COVID-19 pandemic, which necessitated consideration that the patient might be at risk for infection with the SARS-CoV-2 virus that causes COVID-19. Institutional protocols and algorithms that pertain to the evaluation of patients at risk for COVID-19 are in a state of rapid change based on information released by regulatory bodies including the CDC and federal and state organizations. These policies and algorithms were followed during the patient's care in the ED.  Patient presents unresponsive likely unintentional drug overdose.  Patient with pinpoint pupils and bradypnea.  Likely related to opiate use. IV was established by EMS.  Patient given 0.4 mg of Narcan to which she responded.  Patient provided with IV fluids as well. After Narcan, patient awoke and became agitated. Patient was oriented.  Denied any heroin use. We were able to redirect him back into bed. Patient is  now at home.  Resting in bed during her to allow patient to metabolize. we will monitor closely  4:20 AM Still sleeping comfortable. HDS. No respiratory decline. IVC papers brought by [employer], taken out by family. First exam filled.  5:59 AM  More alert. Labs reassuring. Still needs to MTF further Medically cleared for [facility] eval once MTF.      Final Clinical Impression(s) / ED Diagnoses Final diagnoses:  Polysubstance abuse (HCC)  Opiate overdose, undetermined intent, initial encounter [facility])      This chart was dictated using voice recognition software.  Despite best efforts to proofread,  errors can occur which can change the documentation meaning.   Nira Conn, MD 04/29/19 0630

## 2019-04-29 NOTE — Patient Outreach (Signed)
CPSS tried meeting with the patient in order to provide substance use recovery support and provide information for substance use recovery resources, but the patient was unable to participate in the CPSS assessment as this time. Patient was unable to participate in the CPSS assessment due to the patient being very sleepy and was not able to stay awake when prompted by CPSS. CPSS left information for substance use recovery resources at beside. CPSS will try to meet with the patient again at a later time today 04/29/19 when the patient is able to participate in the CPSS assessment.

## 2019-04-29 NOTE — BH Assessment (Addendum)
Assessment Note  Arthur Martinez is an 19 y.o. male that presents this date with IVC. Per IVC patient has been using a excessive amounts of multiple substances and attempted to overdose prior to arrival. Patient denies content of IVC at the time of assessment. Patient denies any S/I, H/I or AVH. Patient denies overdose although reports ongoing use of multiple substances. Per notes patient has a past history of depression and polysubstance abuse who presents to the emergency department after being found impaired by his mother prior to arrival. Patient's mother stated patient had  likely overdosed. EMS was called out to the patient's home by his mother after he became unresponsive. Mother reports that the patient had just come in after sitting up in the car with a friend. When EMS arrived and patient was awaken, he became agitated requiring medication prior to transport.  Patient is well known to Behavioral Health having been assessed on numerous occasions in the ED and was last seen on 04/19/19 when he presented with similar symptoms. Patient has been diagnosed with bipolar disorder in the past as well as reporting he uses multiple substances. Patient states that every time he "parties too much" his mother gets upset and has him committed. Patient does admit to ongoing use of alcohol, opioids and marijuana but is vague in reference to amounts and time frame this date. Patient denies any current withdrawals or having a OP provider to assist with any MH disorder. Patient denies any current symptoms. Patient is observed to be drowsy at the time of assessment although is oriented x 4. Patient's speech is logical and coherent. Patient's concentration is good. Patient's mood is pleasant and congruent with affect. Patient did not appear to responding to delusions or internal stimuli. Patient is currently contracting for safety. Case was staffed with Arlana Pouch NP who recommended patient be discharged after meeting with peer  support to assist with ongoing SA issues.    Diagnosis: Substance induced mood disorder.   Past Medical History:  Past Medical History:  Diagnosis Date  . Bipolar 1 disorder (HCC)   . Hypertension     Past Surgical History:  Procedure Laterality Date  . CIRCUMCISION      Family History:  Family History  Problem Relation Age of Onset  . Heart attack Maternal Grandfather   . Bipolar disorder Maternal Grandfather   . Schizophrenia Maternal Grandfather   . Stroke Paternal Grandfather   . Depression Mother   . Anxiety disorder Mother   . ADD / ADHD Brother   . Depression Maternal Grandmother   . Anxiety disorder Maternal Grandmother   . Migraines Other     Social History:  reports that he has been smoking cigarettes. He has a 7.00 pack-year smoking history. He has never used smokeless tobacco. He reports current alcohol use of about 2.0 standard drinks of alcohol per week. He reports current drug use. Frequency: 7.00 times per week. Drugs: Marijuana, Heroin, Amphetamines, and Benzodiazepines.  Additional Social History:  Alcohol / Drug Use Pain Medications: See MAR Prescriptions: See MAR Over the Counter: See MAR History of alcohol / drug use?: Yes Longest period of sobriety (when/how long): Unknown Negative Consequences of Use: Personal relationships Withdrawal Symptoms: (Denies) Substance #1 Name of Substance 1: Alcohol 1 - Age of First Use: 14 1 - Amount (size/oz): Varies 1 - Frequency: Varies 1 - Duration: Ongoing 1 - Last Use / Amount: 04/28/19 Pt states "a couple bers" Substance #2 Name of Substance 2: Cannabis 2 - Age  of First Use: 14 2 - Amount (size/oz): Varies 2 - Frequency: Varies 2 - Duration: Ongoing 2 - Last Use / Amount: 04/28/19 Pt states one gram Substance #3 Name of Substance 3: Opiates 3 - Age of First Use: 17 3 - Amount (size/oz): Varies 3 - Frequency: Varies 3 - Duration: Ongoing 3 - Last Use / Amount: Pt states he cannot recall  CIWA:  CIWA-Ar BP: (!) 100/58 Pulse Rate: (!) 47 COWS:    Allergies:  Allergies  Allergen Reactions  . Tylenol [Acetaminophen] Hives and Itching    Home Medications: (Not in a hospital admission)   OB/GYN Status:  No LMP for male patient.  General Assessment Data Location of Assessment: [facility] TTS Assessment: In system Is this a Tele or Face-to-Face Assessment?: Face-to-Face Is this an Initial Assessment or a Re-assessment for this encounter?: Initial Assessment Patient Accompanied by:: Other Language Other than English: No Living Arrangements: Other (Comment)(Parents) What gender do you identify as?: Male Marital status: Single Living Arrangements: Parent Can pt return to current living arrangement?: Yes Admission Status: Involuntary Petitioner: Family member Is patient capable of signing voluntary admission?: Yes Referral Source: Self/Family/Friend Insurance type: Medicaid     Crisis Care Plan Living Arrangements: Parent Legal Guardian: (NA) Name of Psychiatrist: None Name of Therapist: None  Education Status Is patient currently in school?: No Current Grade: (NA) Is the patient employed, unemployed or receiving disability?: Unemployed  Risk to self with the past 6 months Suicidal Ideation: No Has patient been a risk to self within the past 6 months prior to admission? : No Suicidal Intent: No Has patient had any suicidal intent within the past 6 months prior to admission? : No Is patient at risk for suicide?: No Suicidal Plan?: No Has patient had any suicidal plan within the past 6 months prior to admission? : No Access to Means: No What has been your use of drugs/alcohol within the last 12 months?: Current use Previous Attempts/Gestures: Yes How many times?: 1 Other Self Harm Risks: (Excessive SA use) Triggers for Past Attempts: Unknown Intentional Self Injurious Behavior: None Family Suicide History: No Recent stressful life event(s): Other  (Comment)(Excessive SA use) Persecutory voices/beliefs?: No Depression: No Depression Symptoms: (Denies symptoms) Substance abuse history and/or treatment for substance abuse?: Yes Suicide prevention information given to non-admitted patients: Not applicable  Risk to Others within the past 6 months Homicidal Ideation: No Does patient have any lifetime risk of violence toward others beyond the six months prior to admission? : No Thoughts of Harm to Others: No Current Homicidal Intent: No Current Homicidal Plan: No Access to Homicidal Means: No Identified Victim: NA History of harm to others?: No Assessment of Violence: None Noted Violent Behavior Description: NA Does patient have access to weapons?: No Criminal Charges Pending?: No Describe Pending Criminal Charges: NA Does patient have a court date: No Court Date: (NA) Is patient on probation?: Unknown  Psychosis Hallucinations: None noted Delusions: None noted  Mental Status Report Appearance/Hygiene: Unremarkable Eye Contact: Poor Motor Activity: Unsteady Speech: Soft, Slow Level of Consciousness: Drowsy Mood: Pleasant Affect: Appropriate to circumstance Anxiety Level: Minimal Thought Processes: Coherent, Relevant Judgement: Partial Orientation: Person, Place, Time Obsessive Compulsive Thoughts/Behaviors: None  Cognitive Functioning Concentration: Decreased Memory: Recent Intact, Remote Intact Is patient IDD: No Insight: Fair Impulse Control: Fair Appetite: Good Have you had any weight changes? : No Change Sleep: No Change Total Hours of Sleep: 7 Vegetative Symptoms: None  ADLScreening [facility] Assessment Services) Patient's cognitive ability adequate to  safely complete daily activities?: Yes Patient able to express need for assistance with ADLs?: Yes Independently performs ADLs?: Yes (appropriate for developmental age)  Prior Inpatient Therapy Prior Inpatient Therapy: Yes Prior Therapy Dates: 2020 Prior  Therapy Facilty/Provider(s): [facility] Reason for Treatment: MH/SA issues  Prior Outpatient Therapy Prior Outpatient Therapy: No Does patient have an ACCT team?: No Does patient have Intensive In-House Services?  : No Does patient have [facility] services? : No Does patient have P4CC services?: No  ADL Screening (condition at time of admission) Patient's cognitive ability adequate to safely complete daily activities?: Yes Is the patient deaf or have difficulty hearing?: No Does the patient have difficulty seeing, even when wearing glasses/contacts?: No Does the patient have difficulty concentrating, remembering, or making decisions?: No Patient able to express need for assistance with ADLs?: Yes Does the patient have difficulty dressing or bathing?: No Independently performs ADLs?: Yes (appropriate for developmental age) Does the patient have difficulty walking or climbing stairs?: No Weakness of Legs: None Weakness of Arms/Hands: None  Home Assistive Devices/Equipment Home Assistive Devices/Equipment: None  Therapy Consults (therapy consults require a physician order) PT Evaluation Needed: No OT Evalulation Needed: No SLP Evaluation Needed: No Abuse/Neglect Assessment (Assessment to be complete while patient is alone) Physical Abuse: Denies Verbal Abuse: Denies Sexual Abuse: Denies Exploitation of patient/patient's resources: Denies Self-Neglect: Denies Values / Beliefs Cultural Requests During Hospitalization: None Spiritual Requests During Hospitalization: None Consults Spiritual Care Consult Needed: No Transition of Care Team Consult Needed: No Advance Directives (For Healthcare) Does Patient Have a Medical Advance Directive?: No Would patient like information on creating a medical advance directive?: No - Patient declined          Disposition: Case was staffed with Hall Busing NP who recommended patient be discharged after meeting with peer support to assist with ongoing SA  issues.  Disposition Initial Assessment Completed for this Encounter: Yes Disposition of Patient: Discharge  On Site Evaluation by:   Reviewed with Physician:    Mamie Nick 04/29/2019 11:11 AM

## 2019-04-29 NOTE — ED Notes (Signed)
Thomas Hoff, mother, wants to be called, [phone] concerned he shouldn't be discharged.

## 2019-05-07 ENCOUNTER — Emergency Department (HOSPITAL_COMMUNITY)
Admission: EM | Admit: 2019-05-07 | Discharge: 2019-05-07 | Disposition: A | Discharge status: Home / Self Care | Payer: Medicaid Other | Attending: Emergency Medicine | Admitting: Emergency Medicine

## 2019-05-07 ENCOUNTER — Emergency Department (HOSPITAL_COMMUNITY): Payer: Medicaid Other

## 2019-05-07 ENCOUNTER — Other Ambulatory Visit: Payer: Self-pay

## 2019-05-07 DIAGNOSIS — F17210 Nicotine dependence, cigarettes, uncomplicated: Secondary | ICD-10-CM | POA: Insufficient documentation

## 2019-05-07 DIAGNOSIS — I10 Essential (primary) hypertension: Secondary | ICD-10-CM | POA: Diagnosis not present

## 2019-05-07 DIAGNOSIS — Z79899 Other long term (current) drug therapy: Secondary | ICD-10-CM | POA: Diagnosis not present

## 2019-05-07 DIAGNOSIS — R0781 Pleurodynia: Secondary | ICD-10-CM | POA: Insufficient documentation

## 2019-05-07 MED ORDER — NAPROXEN 250 MG PO TABS
500.0000 mg | ORAL_TABLET | Freq: Once | ORAL | Status: AC
  Administered 2019-05-07: 06:00:00 500 mg via ORAL
  Filled 2019-05-07: qty 2
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
## 2021-07-17 ENCOUNTER — Encounter: Payer: Self-pay | Admitting: Emergency Medicine

## 2021-07-17 ENCOUNTER — Emergency Department
Admission: EM | Admit: 2021-07-17 | Discharge: 2021-07-17 | Disposition: A | Discharge status: Home / Self Care | Payer: Medicaid Other | Attending: Emergency Medicine | Admitting: Emergency Medicine

## 2021-07-17 ENCOUNTER — Other Ambulatory Visit: Payer: Self-pay

## 2021-07-17 DIAGNOSIS — R109 Unspecified abdominal pain: Secondary | ICD-10-CM | POA: Insufficient documentation

## 2021-07-17 DIAGNOSIS — R197 Diarrhea, unspecified: Secondary | ICD-10-CM | POA: Insufficient documentation

## 2021-07-17 DIAGNOSIS — F1113 Opioid abuse with withdrawal: Secondary | ICD-10-CM | POA: Insufficient documentation

## 2021-07-17 DIAGNOSIS — R451 Restlessness and agitation: Secondary | ICD-10-CM | POA: Insufficient documentation

## 2021-07-17 DIAGNOSIS — F1193 Opioid use, unspecified with withdrawal: Secondary | ICD-10-CM

## 2021-07-17 DIAGNOSIS — I10 Essential (primary) hypertension: Secondary | ICD-10-CM | POA: Insufficient documentation

## 2021-07-17 DIAGNOSIS — R251 Tremor, unspecified: Secondary | ICD-10-CM | POA: Insufficient documentation

## 2021-07-17 LAB — CBC
HCT: 42.9 % (ref 39.0–52.0)
Hemoglobin: 14.1 g/dL (ref 13.0–17.0)
MCH: 30.3 pg (ref 26.0–34.0)
MCHC: 32.9 g/dL (ref 30.0–36.0)
MCV: 92.1 fL (ref 80.0–100.0)
Platelets: 324 10*3/uL (ref 150–400)
RBC: 4.66 MIL/uL (ref 4.22–5.81)
RDW: 13 % (ref 11.5–15.5)
WBC: 10.2 10*3/uL (ref 4.0–10.5)
nRBC: 0 % (ref 0.0–0.2)

## 2021-07-17 LAB — COMPREHENSIVE METABOLIC PANEL
ALT: 15 U/L (ref 0–44)
AST: 22 U/L (ref 15–41)
Albumin: 4.3 g/dL (ref 3.5–5.0)
Alkaline Phosphatase: 79 U/L (ref 38–126)
Anion gap: 11 (ref 5–15)
BUN: 10 mg/dL (ref 6–20)
CO2: 26 mmol/L (ref 22–32)
Calcium: 10 mg/dL (ref 8.9–10.3)
Chloride: 104 mmol/L (ref 98–111)
Creatinine, Ser: 0.91 mg/dL (ref 0.61–1.24)
GFR, Estimated: >60 mL/min (ref >60)
Glucose, Bld: 78 mg/dL (ref 70–99)
Potassium: 3.7 mmol/L (ref 3.5–5.1)
Sodium: 141 mmol/L (ref 135–145)
Total Bilirubin: 0.7 mg/dL (ref 0.3–1.2)
Total Protein: 8.3 g/dL — ABNORMAL HIGH (ref 6.5–8.1)

## 2021-07-17 MED ORDER — ONDANSETRON 4 MG PO TBDP: 4.0000 mg | ORAL_TABLET | Freq: Three times a day (TID) | ORAL | 0 refills | Status: AC | PRN

## 2021-07-17 MED ORDER — KETOROLAC TROMETHAMINE 15 MG/ML IJ SOLN
15.0000 mg | Freq: Once | INTRAMUSCULAR | Status: AC
  Administered 2021-07-17: 15 mg via INTRAVENOUS
  Filled 2021-07-17: qty 1

## 2021-07-17 MED ORDER — CLONIDINE HCL 0.1 MG PO TABS
0.2000 mg | ORAL_TABLET | Freq: Once | ORAL | Status: AC
  Administered 2021-07-17: 0.2 mg via ORAL
  Filled 2021-07-17: qty 2

## 2021-07-17 MED ORDER — ONDANSETRON HCL 4 MG/2ML IJ SOLN
4.0000 mg | Freq: Once | INTRAMUSCULAR | Status: AC
  Administered 2021-07-17: 4 mg via INTRAVENOUS
  Filled 2021-07-17: qty 2

## 2021-07-17 MED ORDER — SODIUM CHLORIDE 0.9 % IV BOLUS
1000.0000 mL | Freq: Once | INTRAVENOUS | Status: AC
  Administered 2021-07-17: 1000 mL via INTRAVENOUS

## 2021-07-17 MED ORDER — DICYCLOMINE HCL 10 MG PO CAPS
10.0000 mg | ORAL_CAPSULE | Freq: Once | ORAL | Status: AC
  Administered 2021-07-17: 10 mg via ORAL
  Filled 2021-07-17: qty 1

## 2021-07-17 NOTE — ED Triage Notes (Signed)
Pt states that he is going through withdrawal from opiates, states the last time he used anything was yesterday, pt states that a friend of his gave him some suboxone and he took 2 tablets and states that was around 0800 and he has never taken before, pt asking for something for withdrawal in triage, pt is fidgety  ?

## 2021-07-17 NOTE — Discharge Instructions (Addendum)
-  Follow-up with the listed clinic provided by the social worker ? ?-Return to the emergency department anytime if you begin to experience any new or worsening symptoms. ?
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
## 2021-07-17 NOTE — ED Provider Notes (Signed)
[facility] Provider Note    Event Date/Time   First MD Initiated Contact with Patient 07/17/21 1306     (approximate)   History   Chief Complaint Withdrawal   HPI Arthur Martinez is a 21 y.o. male, history of anxiety, MDD, substance-induced mood disorder, opiate use disorder, bipolar 1, hypertension, presents to the emergency department for evaluation of suspected opiate withdrawal.  Patient states that he last used heroin yesterday.  Reports snorting it.  Since then, patient has been experiencing tremors, restlessness, nausea/vomiting, abdominal pain/cramping, and diarrhea.  Denies fever/chills, chest pain, shortness of breath, dysuria, headache, rash/lesions, or dizziness/lightheadedness.  History Limitations: No limitations        Physical Exam  Triage Vital Signs: ED Triage Vitals  Enc Vitals Group     BP 07/17/21 1337 131/74     Pulse Rate 07/17/21 1013 95     Resp 07/17/21 1013 20     Temp 07/17/21 1013 97.6 F (36.4 C)     Temp Source 07/17/21 1013 Oral     SpO2 07/17/21 1013 100 %     Weight 07/17/21 1014 170 lb (77.1 kg)     Height 07/17/21 1014 5\' 8"  (1.727 m)     Head Circumference --      Peak Flow --      Pain Score 07/17/21 1013 8     Pain Loc --      Pain Edu? --      Excl. in GC? --     Most recent vital signs: Vitals:   07/17/21 1337 07/17/21 1539  BP: 131/74 128/70  Pulse: 92 88  Resp: 20 18  Temp:    SpO2: 100% 99%    General: Awake, laying in the fetal position.  Appears uncomfortable and fidgety Skin: Warm, dry. No rashes or lesions.  Eyes: PERRL. Conjunctivae normal.  CV: Good peripheral perfusion.  Resp: Normal effort.  Abd: Soft, non-tender. No distention.  Neuro: At baseline. No gross neurological deficits.   Focused Exam: N/A.  Physical Exam    ED Results / Procedures / Treatments  Labs (all labs ordered are listed, but only abnormal results are displayed) Labs Reviewed  COMPREHENSIVE  METABOLIC PANEL - Abnormal; Notable for the following components:      Result Value   Total Protein 8.3 (*)    All other components within normal limits  CBC  URINALYSIS, ROUTINE W REFLEX MICROSCOPIC  URINE DRUG SCREEN, QUALITATIVE ([facility] ONLY)     EKG N/A.   RADIOLOGY  ED Provider Interpretation: N/A  No results found.  PROCEDURES:  Critical Care performed: N/A.  Procedures    MEDICATIONS ORDERED IN ED: Medications  sodium chloride 0.9 % bolus 1,000 mL (0 mLs Intravenous Stopped 07/17/21 1538)  ondansetron (ZOFRAN) injection 4 mg (4 mg Intravenous Given 07/17/21 1334)  dicyclomine (BENTYL) capsule 10 mg (10 mg Oral Given 07/17/21 1345)  ketorolac (TORADOL) 15 MG/ML injection 15 mg (15 mg Intravenous Given 07/17/21 1347)  cloNIDine (CATAPRES) tablet 0.2 mg (0.2 mg Oral Given 07/17/21 1346)     IMPRESSION / MDM / ASSESSMENT AND PLAN / ED COURSE  I reviewed the triage vital signs and the nursing notes.                              Differential diagnosis includes, but is not limited to, opiate withdrawal, viral syndrome, gastroenteritis.   ED Course Patient appears stable, but  uncomfortable.  Vitals within normal limits.  We will go ahead initiate symptomatic treatment with IV fluids, ketorolac, clonidine, and dicyclomine.  CBC shows no leukocytosis or anemia.  CMP shows no evidence of transaminitis, electrolyte abnormalities, or kidney injury.  Urinalysis pending.  Assessment/Plan Presentation consistent with mild opiate withdrawal.  Low suspicion for any serious or life-threatening pathology at this time based on history and physical exam.  Physical exam is reassuring.  Patient appears to have responded well to the symptomatic treatment.  As the patient was halfway done with his IV fluids, he stated that he wished to leave immediately and did not want any further treatment.  TTS spoke with him at length in regards to opiate addiction clinics nearby.  He said that he  will explore outpatient clinics, but he is not ready for any inpatient treatment at this time.  He is currently stable.  We will plan to discharge with a prescription for ondansetron.  Provided the patient with anticipatory guidance, return precautions, and educational material. Encouraged the patient to return to the emergency department at any time if they begin to experience any new or worsening symptoms. Patient expressed understanding and agreed with the plan.       FINAL CLINICAL IMPRESSION(S) / ED DIAGNOSES   Final diagnoses:  Opiate withdrawal (HCC)     Rx / DC Orders   ED Discharge Orders          Ordered    ondansetron (ZOFRAN-ODT) 4 MG disintegrating tablet  Every 8 hours PRN        07/17/21 1534             Note:  This document was prepared using Dragon voice recognition software and may include unintentional dictation errors.   Varney Daily, [place] 07/17/21 1614    Concha Se, MD 07/18/21 1118

## 2021-10-17 ENCOUNTER — Ambulatory Visit (HOSPITAL_COMMUNITY)
Admission: EM | Admit: 2021-10-17 | Discharge: 2021-10-18 | Disposition: A | Discharge status: Home / Self Care | Payer: No Payment, Other | Attending: Psychiatry | Admitting: Psychiatry

## 2021-10-17 DIAGNOSIS — R4689 Other symptoms and signs involving appearance and behavior: Secondary | ICD-10-CM

## 2021-10-17 DIAGNOSIS — F1911 Other psychoactive substance abuse, in remission: Secondary | ICD-10-CM | POA: Insufficient documentation

## 2021-10-17 DIAGNOSIS — R45851 Suicidal ideations: Secondary | ICD-10-CM | POA: Insufficient documentation

## 2021-10-17 DIAGNOSIS — R4589 Other symptoms and signs involving emotional state: Secondary | ICD-10-CM

## 2021-10-17 DIAGNOSIS — Z20822 Contact with and (suspected) exposure to covid-19: Secondary | ICD-10-CM | POA: Insufficient documentation

## 2021-10-17 LAB — COMPREHENSIVE METABOLIC PANEL
ALT: 11 U/L (ref 0–44)
AST: 14 U/L — ABNORMAL LOW (ref 15–41)
Albumin: 3.9 g/dL (ref 3.5–5.0)
Alkaline Phosphatase: 107 U/L (ref 38–126)
Anion gap: 10 (ref 5–15)
BUN: 5 mg/dL — ABNORMAL LOW (ref 6–20)
CO2: 27 mmol/L (ref 22–32)
Calcium: 9.7 mg/dL (ref 8.9–10.3)
Chloride: 101 mmol/L (ref 98–111)
Creatinine, Ser: 0.8 mg/dL (ref 0.61–1.24)
GFR, Estimated: >60 mL/min (ref >60)
Glucose, Bld: 101 mg/dL — ABNORMAL HIGH (ref 70–99)
Potassium: 3.7 mmol/L (ref 3.5–5.1)
Sodium: 138 mmol/L (ref 135–145)
Total Bilirubin: 0.9 mg/dL (ref 0.3–1.2)
Total Protein: 7.7 g/dL (ref 6.5–8.1)

## 2021-10-17 LAB — CBC WITH DIFFERENTIAL/PLATELET
Abs Immature Granulocytes: 0.02 10*3/uL (ref 0.00–0.07)
Basophils Absolute: 0.1 10*3/uL (ref 0.0–0.1)
Basophils Relative: 1 %
Eosinophils Absolute: 0.3 10*3/uL (ref 0.0–0.5)
Eosinophils Relative: 3 %
HCT: 37.6 % — ABNORMAL LOW (ref 39.0–52.0)
Hemoglobin: 12.7 g/dL — ABNORMAL LOW (ref 13.0–17.0)
Immature Granulocytes: 0 %
Lymphocytes Relative: 30 %
Lymphs Abs: 3 10*3/uL (ref 0.7–4.0)
MCH: 30.8 pg (ref 26.0–34.0)
MCHC: 33.8 g/dL (ref 30.0–36.0)
MCV: 91.3 fL (ref 80.0–100.0)
Monocytes Absolute: 0.5 10*3/uL (ref 0.1–1.0)
Monocytes Relative: 5 %
Neutro Abs: 6.3 10*3/uL (ref 1.7–7.7)
Neutrophils Relative %: 61 %
Platelets: 345 10*3/uL (ref 150–400)
RBC: 4.12 MIL/uL — ABNORMAL LOW (ref 4.22–5.81)
RDW: 12.1 % (ref 11.5–15.5)
WBC: 10.3 10*3/uL (ref 4.0–10.5)
nRBC: 0 % (ref 0.0–0.2)

## 2021-10-17 LAB — LIPID PANEL
Cholesterol: 165 mg/dL (ref 0–200)
HDL: 47 mg/dL (ref >40)
LDL Cholesterol: 101 mg/dL — ABNORMAL HIGH (ref 0–99)
Total CHOL/HDL Ratio: 3.5 RATIO
Triglycerides: 85 mg/dL (ref <150)
VLDL: 17 mg/dL (ref 0–40)

## 2021-10-17 LAB — ETHANOL: Alcohol, Ethyl (B): <10 mg/dL (ref <10)

## 2021-10-17 LAB — HEMOGLOBIN A1C
Hgb A1c MFr Bld: 5.3 % (ref 4.8–5.6)
Mean Plasma Glucose: 105.41 mg/dL

## 2021-10-17 LAB — POCT URINE DRUG SCREEN - MANUAL ENTRY (I-SCREEN)
POC Amphetamine UR: NOT DETECTED
POC Buprenorphine (BUP): NOT DETECTED
POC Cocaine UR: POSITIVE — AB
POC Marijuana UR: POSITIVE — AB
POC Methadone UR: NOT DETECTED
POC Methamphetamine UR: POSITIVE — AB
POC Morphine: POSITIVE — AB
POC Oxazepam (BZO): NOT DETECTED
POC Oxycodone UR: NOT DETECTED
POC Secobarbital (BAR): NOT DETECTED

## 2021-10-17 LAB — POC SARS CORONAVIRUS 2 AG: SARSCOV2ONAVIRUS 2 AG: NEGATIVE

## 2021-10-17 LAB — TSH: TSH: 5.037 u[IU]/mL — ABNORMAL HIGH (ref 0.350–4.500)

## 2021-10-17 MED ORDER — OLANZAPINE 5 MG PO TBDP: 5.0000 mg | ORAL_TABLET | Freq: Three times a day (TID) | ORAL | Status: DC | PRN

## 2021-10-17 MED ORDER — MAGNESIUM HYDROXIDE 400 MG/5ML PO SUSP: 30.0000 mL | Freq: Every day | ORAL | Status: DC | PRN

## 2021-10-17 MED ORDER — ZIPRASIDONE MESYLATE 20 MG IM SOLR: 20.0000 mg | INTRAMUSCULAR | Status: DC | PRN

## 2021-10-17 MED ORDER — LORAZEPAM 1 MG PO TABS: 1.0000 mg | ORAL_TABLET | ORAL | Status: DC | PRN

## 2021-10-17 MED ORDER — ALUM & MAG HYDROXIDE-SIMETH 200-200-20 MG/5ML PO SUSP: 30.0000 mL | ORAL | Status: DC | PRN

## 2021-10-17 MED ORDER — ACETAMINOPHEN 325 MG PO TABS: 650.0000 mg | ORAL_TABLET | Freq: Four times a day (QID) | ORAL | Status: DC | PRN
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
## 2021-10-17 NOTE — Progress Notes (Signed)
   10/17/21 1945  [facility] Triage Screening (Walk-ins at [facility] only)  How Did You Hear About [place]? Other (Comment)  What Is the Reason for Your Visit/Call Today? Arthur Martinez is a 21 year old male presenting to [facility] under IVC due to SI. Patient denied SI, HI, psychosis and alcohol/drug usage. Patient report that his father made this story up and stated, "all I said was how would you feel if this was the last conversation you had with me". Patient is calm and cooperative.  PER IVC  [name] is a 21 year old male. No known diagnosis. [name] has expressed to several family member, including the petitioner (father), stating that he no longer wanted to live and planned to kill himself. [name] also told his dad that when he returns home, he would have to clean up his blood. Petitioner expressed that for the last few years, respondent has experienced what he feel is depression due to his brothers death. Also, respondent has been physically combative towards people.  How Long Has This Been Causing You Problems? <Week  Have You Recently Had Any Thoughts About Hurting Yourself? No  Are You Planning to Commit Suicide/Harm Yourself At This time? No  Have you Recently Had Thoughts About Hurting Someone Karolee Ohs? No  Are You Planning To Harm Someone At This Time? No  Are you currently experiencing any auditory, visual or other hallucinations? No  Have You Used Any Alcohol or Drugs in the Past 24 Hours? No  Do you have any current medical co-morbidities that require immediate attention? No  Clinician description of patient physical appearance/behavior: casual / cooperative  What Do You Feel Would Help You the Most Today? Treatment for Depression or other mood problem  If access to [facility] was not available, would you have sought care in the Emergency Department? Yes  Determination of Need Urgent (48 hours)  Options For Referral Medication Management;Facility-Based  Crisis;BH Urgent Care;Outpatient Therapy

## 2021-10-18 LAB — RESP PANEL BY RT-PCR (FLU A&B, COVID) ARPGX2
Influenza A by PCR: NEGATIVE
Influenza B by PCR: NEGATIVE
SARS Coronavirus 2 by RT PCR: NEGATIVE

## 2021-10-18 NOTE — ED Notes (Signed)
Sleeping with no signs of distress or sleep disturbance. 

## 2021-10-18 NOTE — ED Notes (Addendum)
Pt sitting on bed stating, "I'm ready to go. I had an interview this morning a but I've missed it. I don't understand why I've been IVC'd anyway". Pt easily agitated. Paces unit. Pt states, "It help keep me calm. This is crazy. Why am I still here?" Encouraged pt to remain calm and patient as provider makes a decision. Pt verbalized agreement. Writer observed ankle bracelet to be intact. Will continue to monitor for safety.

## 2021-10-18 NOTE — ED Notes (Signed)
Pt asleep at this hour. No apparent distress. RR even and unlabored. Monitored for safety.  

## 2021-10-18 NOTE — Discharge Instructions (Addendum)
Patient is instructed prior to discharge to:  Take all medications as prescribed by his/her mental healthcare provider. Report any adverse effects and or reactions from the medicines to his/her outpatient provider promptly. Keep all scheduled appointments, to ensure that you are getting refills on time and to avoid any interruption in your medication.  If you are unable to keep an appointment call to reschedule.  Be sure to follow-up with resources and follow-up appointments provided.  Patient has been instructed & cautioned: To not engage in alcohol and or illegal drug use while on prescription medicines. In the event of worsening symptoms, patient is instructed to call the crisis hotline, 911 and or go to the nearest ED for appropriate evaluation and treatment of symptoms. To follow-up with his/her primary care provider for your other medical issues, concerns and or health care needs.   You reported interest in the chemical dependency intensive outpatient program (CD IOP) For more information, call Myrna Blazer, LCSW at [phone]. We work directly with employers and families to ensure you receive the care you need.  Please come to [facility] during walk in hours to establish care for medication management or therapy:   Address:  [address], in [place], [address] Ph: [phone]   New patient medication management hours: Mondays, Wednesdays, Thursdays, and Fridays from 8 AM to 11 AM Please show up by 7:00 AM to ensure you are seen  New patient therapy walk in hours: Mondays and Wednesdays from 8 AM to 11 AM Please show up by 7:00 AM to ensure you are seen

## 2021-10-18 NOTE — ED Notes (Signed)
Patient A&O x 4, ambulatory. Patient discharged in no acute distress. Patient denied SI/HI, A/VH upon discharge. Patient verbalized understanding of all discharge instructions explained by staff, to include follow up appointments and safety plan. Patient reported mood 10/10. Pt belongings returned to patient from locker #7 intact. Patient escorted to lobby via staff with bus pass in hand. Safety maintained.

## 2021-10-18 NOTE — ED Provider Notes (Addendum)
FBC/OBS ASAP Discharge Summary  Date and Time: 10/18/2021 1:06 PM  Name: Arthur Martinez  MRN:  [id number]   Discharge Diagnoses:  Final diagnoses:  Aggressive behavior  Suicidal ideation  Ineffective coping    Subjective:  Arthur Martinez is a 21 year old male with a past psychiatric history of polysubstance abuse, major depressive disorder versus substance-induced mood disorder.  He is previously admitted to the [facility] behavioral [facility] hospital in 2020 after an overdose on Xanax and heroin.  The patient presented under involuntary commitment, paperwork filled out by his father, Khylen Riolo, [phone].  Per IVC, the patient has been depressed due to the death of his brother several years ago.  The document reports that the patient has stated that he has a plan to kill himself and that his father will have to "clean up the blood".  There is a vague statement about the patient being "physically combative".  On assessment this morning, the patient exhibits a linear and logical thought process with a euthymic mood and affect.  The patient answers questions appropriately and is future oriented with concrete goals that he is able to state.  He denies the contents of the IVC.  He states that his suicidal statements were made as "what if" statements and that he had no intention of following through on them.  He states that last time he truly had suicidal thoughts was in January of this year.  The patient states "when I said those things I was being dramatic".  The patient denies feeling down and depressed recently.  He states that he has been sleeping and eating well.    The patient states that he got out of prison in November after serving 25 months for gun possession charges.  Presently he denies having any firearms in the home.  He states that he has been working at [employer] 4 days a week and has an Management consultant job interview.  He is asked about his urine drug screen results which show cocaine,  methamphetamine, opiates, and marijuana.  The patient reports "I get high occasionally".  He does not feel he has a major problem with drug use.  The patient was offered admission to the facility based crisis unit which he refused.  He expressed interest in the chemical dependency intensive outpatient program.  He was given information about this program and was discharged.  Stay Summary: There were no behavioral or emotional problems while the patient was admitted to the observation unit.  Total Time spent with patient:  30 minutes  Past Psychiatric History: as above Past Medical History:  Past Medical History:  Diagnosis Date   Bipolar 1 disorder (HCC)    Hypertension     Past Surgical History:  Procedure Laterality Date   CIRCUMCISION     Family History:  Family History  Problem Relation Age of Onset   Heart attack Maternal Grandfather    Bipolar disorder Maternal Grandfather    Schizophrenia Maternal Grandfather    Stroke Paternal Grandfather    Depression Mother    Anxiety disorder Mother    ADD / ADHD Brother    Depression Maternal Grandmother    Anxiety disorder Maternal Grandmother    Migraines Other    Family Psychiatric History: as per H and P Social History:  Social History   Substance and Sexual Activity  Alcohol Use Yes   Alcohol/week: 2.0 standard drinks of alcohol   Types: 2 Cans of beer per week   Comment: occ  Social History   Substance and Sexual Activity  Drug Use Yes   Frequency: 7.0 times per week   Types: Marijuana, Heroin, Amphetamines, Benzodiazepines   Comment: daily use    Social History   Socioeconomic History   Marital status: Single    Spouse name: Not on file   Number of children: Not on file   Years of education: Not on file   Highest education level: Not on file  Occupational History   Occupation: Student  Tobacco Use   Smoking status: Every Day    Packs/day: 1.00    Years: 7.00    Total pack years: 7.00    Types:  Cigarettes   Smokeless tobacco: Never   Tobacco comments:    Tried smoking and dipping once  Vaping Use   Vaping Use: Never used  Substance and Sexual Activity   Alcohol use: Yes    Alcohol/week: 2.0 standard drinks of alcohol    Types: 2 Cans of beer per week    Comment: occ   Drug use: Yes    Frequency: 7.0 times per week    Types: Marijuana, Heroin, Amphetamines, Benzodiazepines    Comment: daily use   Sexual activity: Yes    Birth control/protection: Condom  Other Topics Concern   Not on file  Social History Narrative   Pt lives in [place] with father Deunte Bledsoe, Montez Hageman.. [phone]).  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   Social Determinants of Health   Financial Resource Strain: Not on file  Food Insecurity: Not on file  Transportation Needs: Not on file  Physical Activity: Not on file  Stress: Not on file  Social Connections: Not on file   SDOH:  SDOH Screenings   Alcohol Screen: Low Risk  (03/03/2019)   Alcohol Screen    Last Alcohol Screening Score (AUDIT): 3  Depression (PHQ2-9): Not on file  Financial Resource Strain: Not on file  Food Insecurity: Not on file  Housing: Not on file  Physical Activity: Not on file  Social Connections: Not on file  Stress: Not on file  Tobacco Use: High Risk (07/17/2021)   Patient History    Smoking Tobacco Use: Every Day    Smokeless Tobacco Use: Never    Passive Exposure: Not on file  Transportation Needs: Not on file    Tobacco Cessation:  A prescription for an FDA-approved tobacco cessation medication provided at discharge  Current Medications:  Current Facility-Administered Medications  Medication Dose Route Frequency Provider Last Rate Last Admin   acetaminophen (TYLENOL) tablet 650 mg  650 mg Oral Q6H PRN Sindy Guadeloupe, NP       alum & mag hydroxide-simeth (MAALOX/MYLANTA) 200-200-20 MG/5ML suspension 30 mL  30 mL Oral Q4H PRN Sindy Guadeloupe, NP        OLANZapine zydis (ZYPREXA) disintegrating tablet 5 mg  5 mg Oral Q8H PRN Sindy Guadeloupe, NP       And   LORazepam (ATIVAN) tablet 1 mg  1 mg Oral PRN Sindy Guadeloupe, NP       And   ziprasidone (GEODON) injection 20 mg  20 mg Intramuscular PRN Sindy Guadeloupe, NP       magnesium hydroxide (MILK OF MAGNESIA) suspension 30 mL  30 mL Oral Daily PRN Sindy Guadeloupe, NP       No current outpatient medications on file.    PTA Medications: (Not in a hospital admission)  No data to display          Flowsheet Row ED from 07/17/2021 in [facility] EMERGENCY DEPARTMENT ED from 05/07/2019 in [facility] EMERGENCY DEPARTMENT Admission (Dis[facility]  C-SSRS RISK CATEGORY No Risk No Risk Error: Q3, 4, or 5 should not be populated when Q2 is No       Musculoskeletal  Strength & Muscle Tone: within normal limits Gait & Station: normal Patient leans: N/A  Psychiatric Specialty Exam  Presentation  General Appearance: Casual  Eye Contact:Fair  Speech:Clear and Coherent  Speech Volume:Normal    Mood and Affect  Mood:Euthymic  Affect:Congruent   Thought Process  Thought Processes:Linear; Goal Directed; Coherent  Descriptions of Associations:Intact  Orientation:Full (Time, Place and Person)  Thought Content:Logical     Hallucinations:Hallucinations: None  Ideas of Reference:None  Suicidal Thoughts:Suicidal Thoughts: No  Homicidal Thoughts:Homicidal Thoughts: No   Sensorium  Memory:Immediate Good; Recent Good; Remote Good  Judgment:Fair  Insight:Fair   Executive Functions  Concentration:Fair  Attention Span:Fair  Recall:Fair  Fund of Knowledge:Fair  Language:Fair   Psychomotor Activity  Psychomotor Activity:Psychomotor Activity: Normal   Assets  Assets:Desire for Improvement; Manufacturing systems engineer; Housing   Sleep  Sleep:Sleep:  Fair   Nutritional Assessment (For OBS and FBC admissions only) Has the patient had a weight loss or gain of 10 pounds or more in the last 3 months?: No Has the patient had a decrease in food intake/or appetite?: No Does the patient have dental problems?: No Does the patient have eating habits or behaviors that may be indicators of an eating disorder including binging or inducing vomiting?: No Has the patient recently lost weight without trying?: 0 Has the patient been eating poorly because of a decreased appetite?: 0 Malnutrition Screening Tool Score: 0    Physical Exam  Physical Exam Constitutional:      Appearance: the patient is not toxic-appearing.  Pulmonary:     Effort: Pulmonary effort is normal.  Neurological:     General: No focal deficit present.     Mental Status: the patient is alert and oriented to person, place, and time.   Review of Systems  Respiratory:  Negative for shortness of breath.   Cardiovascular:  Negative for chest pain.  Gastrointestinal:  Negative for abdominal pain, constipation, diarrhea, nausea and vomiting.  Neurological:  Negative for headaches.   Blood pressure 102/70, pulse (!) 57, temperature 97.6 F (36.4 C), temperature source Oral, resp. rate 18, SpO2 99 %. There is no height or weight on file to calculate BMI.  Demographic Factors:  Male and Adolescent or young adult  Loss Factors: NA  Historical Factors: NA  Risk Reduction Factors:   Living with another person, especially a relative, Positive social support, Positive therapeutic relationship, and Positive coping skills or problem solving skills  Continued Clinical Symptoms:  Substance use disorders  Cognitive Features That Contribute To Risk:  None    Suicide Risk:  Mild: The patient presented under involuntary commitment with reported suicidal statements.  The patient reports that the statements were made with no intention of actually harming himself.  There are no reports  of the patient intentionally harming himself or attempting suicide.  Presently he is euthymic with a linear and logical thought process.  Furthermore, the patient demonstrates that he is future oriented with tangible goals for the future.  The patient reports that he desires sobriety and is interested in following up with a  chemical dependency intensive outpatient program.  Plan Of Care/Follow-up recommendations:  Activity: as tolerated  Diet: heart healthy  Other: -Follow-up with your outpatient psychiatric provider - information provided in the discharge instructions.   -Take your psychiatric medications as prescribed at discharge - instructions are provided to you in the discharge paperwork.  -Follow-up with outpatient primary care doctor and other specialists -for management of preventative medicine and chronic medical disease.   -Recommend abstinence from alcohol, tobacco, and other illicit drug use at discharge.   -If your psychiatric symptoms recur, worsen, or if you have side effects to your psychiatric medications, call your outpatient psychiatric provider, [phone] or go to the nearest emergency department.   Disposition: self care  Carlyn Reichert, MD 10/18/2021, 1:06 PM
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
## 2021-10-18 NOTE — ED Notes (Signed)
Pt sleeping in no acute distress. RR even and unlabored. Safety maintained.
